# Patient Record
Sex: Male | Born: 1955 | Race: White | Hispanic: No | Smoking: Current some day smoker
Health system: Southern US, Community
[De-identification: ages and names within clinical notes are randomized; demographics above are authoritative.]

## PROBLEM LIST (undated history)

## (undated) DIAGNOSIS — E7800 Pure hypercholesterolemia, unspecified: Secondary | ICD-10-CM

## (undated) DIAGNOSIS — G43909 Migraine, unspecified, not intractable, without status migrainosus: Secondary | ICD-10-CM

## (undated) DIAGNOSIS — R51 Headache: Secondary | ICD-10-CM

## (undated) DIAGNOSIS — T7840XA Allergy, unspecified, initial encounter: Secondary | ICD-10-CM

## (undated) DIAGNOSIS — K635 Polyp of colon: Secondary | ICD-10-CM

## (undated) DIAGNOSIS — I10 Essential (primary) hypertension: Secondary | ICD-10-CM

## (undated) DIAGNOSIS — K219 Gastro-esophageal reflux disease without esophagitis: Secondary | ICD-10-CM

## (undated) DIAGNOSIS — J189 Pneumonia, unspecified organism: Secondary | ICD-10-CM

## (undated) DIAGNOSIS — K56609 Unspecified intestinal obstruction, unspecified as to partial versus complete obstruction: Secondary | ICD-10-CM

## (undated) DIAGNOSIS — R519 Headache, unspecified: Secondary | ICD-10-CM

## (undated) DIAGNOSIS — R739 Hyperglycemia, unspecified: Secondary | ICD-10-CM

## (undated) DIAGNOSIS — L719 Rosacea, unspecified: Secondary | ICD-10-CM

## (undated) DIAGNOSIS — N529 Male erectile dysfunction, unspecified: Secondary | ICD-10-CM

## (undated) DIAGNOSIS — M1990 Unspecified osteoarthritis, unspecified site: Secondary | ICD-10-CM

## (undated) DIAGNOSIS — K5790 Diverticulosis of intestine, part unspecified, without perforation or abscess without bleeding: Secondary | ICD-10-CM

## (undated) DIAGNOSIS — K222 Esophageal obstruction: Secondary | ICD-10-CM

## (undated) DIAGNOSIS — K529 Noninfective gastroenteritis and colitis, unspecified: Secondary | ICD-10-CM

## (undated) DIAGNOSIS — E785 Hyperlipidemia, unspecified: Secondary | ICD-10-CM

## (undated) DIAGNOSIS — K5792 Diverticulitis of intestine, part unspecified, without perforation or abscess without bleeding: Secondary | ICD-10-CM

## (undated) HISTORY — DX: Hyperlipidemia, unspecified: E78.5

## (undated) HISTORY — DX: Pure hypercholesterolemia, unspecified: E78.00

## (undated) HISTORY — DX: Hyperglycemia, unspecified: R73.9

## (undated) HISTORY — DX: Polyp of colon: K63.5

## (undated) HISTORY — PX: LUMBAR MICRODISCECTOMY: SHX99

## (undated) HISTORY — DX: Gastro-esophageal reflux disease without esophagitis: K21.9

## (undated) HISTORY — DX: Migraine, unspecified, not intractable, without status migrainosus: G43.909

## (undated) HISTORY — DX: Allergy, unspecified, initial encounter: T78.40XA

## (undated) HISTORY — DX: Essential (primary) hypertension: I10

## (undated) HISTORY — DX: Male erectile dysfunction, unspecified: N52.9

## (undated) HISTORY — DX: Diverticulitis of intestine, part unspecified, without perforation or abscess without bleeding: K57.92

## (undated) HISTORY — DX: Headache: R51

## (undated) HISTORY — DX: Unspecified osteoarthritis, unspecified site: M19.90

## (undated) HISTORY — DX: Unspecified intestinal obstruction, unspecified as to partial versus complete obstruction: K56.609

## (undated) HISTORY — DX: Diverticulosis of intestine, part unspecified, without perforation or abscess without bleeding: K57.90

## (undated) HISTORY — DX: Noninfective gastroenteritis and colitis, unspecified: K52.9

## (undated) HISTORY — PX: NASAL SEPTUM SURGERY: SHX37

## (undated) HISTORY — DX: Rosacea, unspecified: L71.9

## (undated) HISTORY — DX: Esophageal obstruction: K22.2

## (undated) HISTORY — DX: Headache, unspecified: R51.9

## (undated) HISTORY — DX: Pneumonia, unspecified organism: J18.9

---

## 2006-01-18 ENCOUNTER — Encounter: Admission: RE | Admit: 2006-01-18 | Discharge: 2006-01-18 | Payer: Self-pay | Admitting: Family Medicine

## 2006-07-08 ENCOUNTER — Ambulatory Visit: Payer: Self-pay | Admitting: Family Medicine

## 2006-12-01 ENCOUNTER — Ambulatory Visit: Payer: Self-pay | Admitting: Family Medicine

## 2007-02-14 ENCOUNTER — Ambulatory Visit: Payer: Self-pay | Admitting: Family Medicine

## 2007-03-17 ENCOUNTER — Ambulatory Visit: Payer: Self-pay | Admitting: Family Medicine

## 2007-11-03 HISTORY — PX: COLONOSCOPY: SHX174

## 2007-11-03 HISTORY — PX: UPPER GASTROINTESTINAL ENDOSCOPY: SHX188

## 2007-11-17 ENCOUNTER — Ambulatory Visit: Payer: Self-pay | Admitting: Family Medicine

## 2007-12-08 ENCOUNTER — Ambulatory Visit: Payer: Self-pay | Admitting: Gastroenterology

## 2007-12-08 DIAGNOSIS — R1310 Dysphagia, unspecified: Secondary | ICD-10-CM | POA: Insufficient documentation

## 2007-12-08 DIAGNOSIS — R1012 Left upper quadrant pain: Secondary | ICD-10-CM | POA: Insufficient documentation

## 2007-12-22 ENCOUNTER — Encounter: Payer: Self-pay | Admitting: Gastroenterology

## 2007-12-22 ENCOUNTER — Ambulatory Visit (HOSPITAL_COMMUNITY): Admission: RE | Admit: 2007-12-22 | Discharge: 2007-12-22 | Payer: Self-pay | Admitting: Gastroenterology

## 2007-12-28 ENCOUNTER — Ambulatory Visit: Payer: Self-pay | Admitting: Gastroenterology

## 2008-01-09 ENCOUNTER — Encounter: Payer: Self-pay | Admitting: Gastroenterology

## 2008-01-12 ENCOUNTER — Ambulatory Visit: Payer: Self-pay | Admitting: Gastroenterology

## 2008-07-30 ENCOUNTER — Ambulatory Visit: Payer: Self-pay | Admitting: Family Medicine

## 2008-07-30 ENCOUNTER — Telehealth: Payer: Self-pay | Admitting: Gastroenterology

## 2008-07-31 ENCOUNTER — Encounter: Payer: Self-pay | Admitting: Gastroenterology

## 2008-07-31 ENCOUNTER — Ambulatory Visit: Payer: Self-pay | Admitting: Gastroenterology

## 2008-07-31 DIAGNOSIS — R1319 Other dysphagia: Secondary | ICD-10-CM | POA: Insufficient documentation

## 2008-07-31 DIAGNOSIS — K219 Gastro-esophageal reflux disease without esophagitis: Secondary | ICD-10-CM | POA: Insufficient documentation

## 2008-08-02 ENCOUNTER — Telehealth: Payer: Self-pay | Admitting: Gastroenterology

## 2008-08-13 ENCOUNTER — Ambulatory Visit: Payer: Self-pay | Admitting: Gastroenterology

## 2008-08-13 ENCOUNTER — Ambulatory Visit (HOSPITAL_COMMUNITY): Admission: RE | Admit: 2008-08-13 | Discharge: 2008-08-13 | Payer: Self-pay | Admitting: Gastroenterology

## 2008-10-11 ENCOUNTER — Ambulatory Visit: Payer: Self-pay | Admitting: Family Medicine

## 2008-12-20 ENCOUNTER — Ambulatory Visit: Payer: Self-pay | Admitting: Family Medicine

## 2009-01-24 ENCOUNTER — Ambulatory Visit: Payer: Self-pay | Admitting: Family Medicine

## 2009-07-30 ENCOUNTER — Telehealth: Payer: Self-pay | Admitting: Gastroenterology

## 2009-12-09 ENCOUNTER — Ambulatory Visit: Payer: Self-pay | Admitting: Family Medicine

## 2009-12-23 ENCOUNTER — Ambulatory Visit: Payer: Self-pay | Admitting: Family Medicine

## 2010-03-20 ENCOUNTER — Ambulatory Visit: Payer: Self-pay | Admitting: Family Medicine

## 2010-03-27 ENCOUNTER — Ambulatory Visit: Payer: Self-pay | Admitting: Family Medicine

## 2011-02-24 ENCOUNTER — Encounter: Payer: Self-pay | Admitting: Family Medicine

## 2011-02-24 DIAGNOSIS — J069 Acute upper respiratory infection, unspecified: Secondary | ICD-10-CM

## 2011-03-17 NOTE — Assessment & Plan Note (Signed)
[facility]                         GASTROENTEROLOGY OFFICE NOTE   NAME:Klingberg, TANMAY HALTEMAN                       MRN:          [id number]  DATE:12/08/2007                            DOB:          [birth year]    PROBLEMS:  1. Dysphagia.  2. Abdominal pain.   Mr. Litaker is a pleasant 55 year old white male, referred through the  courtesy of Dr. Susann Givens for evaluation.  For years he has been suffering  from intermittent dysphagia to solids.  Symptoms clearly have worsened  over the past 2 months where it is almost a daily occurrence.  He has  occasional pyrosis.  He has had the requirement of regurgitating what  sounds like impacted food.  He also complains of a left upper quadrant  discomfort.  He has the feeling to defecate, but has an incomplete  evacuation, passing just loose or watery stools, coincident with his  left upper quadrant pain.  This initially was occurring daily, and now  is intermittent.  He does have a history of erratic bowels consisting of  periods of constipation and diarrhea.  On one occasion he saw a small  amount of blood on the toilet tissue.   PAST MEDICAL HISTORY:  Pertinent for hypertension and high cholesterol.  He suffers from migraines.   FAMILY HISTORY:  Pertinent for a brother with a bleeding disorder, and a  brother and mother with heart disease.   MEDICATIONS:  The only medication is bisoprolol.   He has no allergies.   He does not smoke, he drinks 3 or 4 beers a day.  He is divorced and  works for the [employer].   REVIEW OF SYSTEMS:  Positive for sleeping problems and back pain.   PHYSICAL EXAMINATION:  Pulse 96, blood pressure 130/86, weight 201.  HEENT: EOMI.  PERRLA.  Sclerae are anicteric.  Conjunctivae are pink.  NECK:  Supple without thyromegaly, adenopathy or carotid bruits.  CHEST:  Clear to auscultation and percussion without adventitious  sounds.  CARDIAC:  Regular rhythm; normal S1 S2.  There  are no murmurs, gallops  or rubs.  ABDOMEN:  Bowel sounds are normoactive.  Abdomen is soft, nontender and  nondistended.  There are no abdominal masses, tenderness, splenic  enlargement or hepatomegaly.  EXTREMITIES:  Full range of motion.  No cyanosis, clubbing or edema.  RECTAL:  Deferred.   IMPRESSION:  1. Dysphagia:  Probably secondary to peptic esophageal stricture.  2. Left upper quadrant pain with a change of bowel habits.  A      structural lesion of the colon is a consideration.  This could be a      variant of irritable bowel syndrome.   RECOMMENDATION:  1. Begin Prevacid 30 mg a day.  2. Upper endoscopy with dilatation is indicated.  3. Colonoscopy.     Barbette Hair. Arlyce Dice, MD,FACG  Electronically Signed    RDK/MedQ  DD: 12/08/2007  DT: 12/09/2007  Job #: [id number]   cc:   Sharlot Gowda, M.D.

## 2011-03-17 NOTE — Letter (Signed)
December 08, 2007    [name]. Micheal Hodge   READAM, SANJUAN  MRN:  [id number]  /  DOB:  20-Jan-1956   Dear [name]:   It is my pleasure to have treated you recently as a new patient in my  office.  I appreciate your confidence and the opportunity to participate  in your care.   Since I do have a busy inpatient endoscopy schedule and office schedule,  my office hours vary weekly.  I am, however, available for emergency  calls every day through my office.  If I cannot promptly meet an urgent  office appointment, another one of our gastroenterologists will be able  to assist you.   My well-trained staff are prepared to help you at all times.  For  emergencies after office hours, a physician from our gastroenterology  section is always available through my 24-hour answering service.   While you are under my care, I encourage discussion of your questions  and concerns, and I will be happy to return your calls as soon as I am  available.   Once again, I welcome you as a new patient and I look forward to a happy  and healthy relationship.    Sincerely,      Barbette Hair. Arlyce Dice, MD,FACG  Electronically Signed   RDK/MedQ  DD: 12/08/2007  DT: 12/09/2007  Job #: [id number]

## 2011-03-17 NOTE — Letter (Signed)
December 08, 2007    Sharlot Gowda, M.D.  [address]  [place], [place] [address]   RE:  [name], DOME  MRN:  [id number]  /  DOB:  10-19-1956   Dear Dr. Susann Givens:   Upon your kind referral, I had the pleasure of evaluating your patient  and I am pleased to offer my findings.  I saw Micheal Hodge in the office  today.  Enclosed is a copy of my progress note that details my findings  and recommendations.   Thank you for the opportunity to participate in your patient's care.    Sincerely,      Barbette Hair. Arlyce Dice, MD,FACG  Electronically Signed    RDK/MedQ  DD: 12/08/2007  DT: 12/09/2007  Job #: [id number]

## 2011-03-23 ENCOUNTER — Ambulatory Visit (INDEPENDENT_AMBULATORY_CARE_PROVIDER_SITE_OTHER): Payer: 59 | Admitting: Family Medicine

## 2011-03-23 ENCOUNTER — Encounter: Payer: Self-pay | Admitting: Family Medicine

## 2011-03-23 VITALS — BP 138/90 | HR 72 | Ht 73.2 in | Wt 198.0 lb

## 2011-03-23 DIAGNOSIS — K219 Gastro-esophageal reflux disease without esophagitis: Secondary | ICD-10-CM

## 2011-03-23 DIAGNOSIS — G43909 Migraine, unspecified, not intractable, without status migrainosus: Secondary | ICD-10-CM

## 2011-03-23 DIAGNOSIS — G609 Hereditary and idiopathic neuropathy, unspecified: Secondary | ICD-10-CM

## 2011-03-23 DIAGNOSIS — Z0000 Encounter for general adult medical examination without abnormal findings: Secondary | ICD-10-CM

## 2011-03-23 DIAGNOSIS — I10 Essential (primary) hypertension: Secondary | ICD-10-CM

## 2011-03-23 DIAGNOSIS — E785 Hyperlipidemia, unspecified: Secondary | ICD-10-CM

## 2011-03-23 DIAGNOSIS — G629 Polyneuropathy, unspecified: Secondary | ICD-10-CM

## 2011-03-23 DIAGNOSIS — N529 Male erectile dysfunction, unspecified: Secondary | ICD-10-CM

## 2011-03-23 DIAGNOSIS — K222 Esophageal obstruction: Secondary | ICD-10-CM

## 2011-03-23 LAB — VITAMIN B12: Vitamin B-12: 326 pg/mL (ref 211–911)

## 2011-03-23 LAB — COMPREHENSIVE METABOLIC PANEL
ALT: 21 U/L (ref 0–53)
AST: 18 U/L (ref 0–37)
Albumin: 4.5 g/dL (ref 3.5–5.2)
CO2: 27 mEq/L (ref 19–32)
Calcium: 10 mg/dL (ref 8.4–10.5)
Creat: 0.83 mg/dL (ref 0.40–1.50)
Potassium: 4.8 mEq/L (ref 3.5–5.3)
Total Protein: 7.3 g/dL (ref 6.0–8.3)

## 2011-03-23 LAB — CBC WITH DIFFERENTIAL/PLATELET
Basophils Absolute: 0 10*3/uL (ref 0.0–0.1)
Eosinophils Absolute: 0.1 10*3/uL (ref 0.0–0.7)
Eosinophils Relative: 1 % (ref 0–5)
HCT: 48.6 % (ref 39.0–52.0)
Hemoglobin: 16.4 g/dL (ref 13.0–17.0)
Lymphocytes Relative: 24 % (ref 12–46)
Lymphs Abs: 2 10*3/uL (ref 0.7–4.0)
MCH: 30 pg (ref 26.0–34.0)
MCHC: 33.7 g/dL (ref 30.0–36.0)
Monocytes Absolute: 0.7 10*3/uL (ref 0.1–1.0)
Monocytes Relative: 9 % (ref 3–12)
RBC: 5.46 MIL/uL (ref 4.22–5.81)

## 2011-03-23 LAB — POCT URINALYSIS DIPSTICK
Blood, UA: NEGATIVE
Protein, UA: NEGATIVE
Spec Grav, UA: 1.025
Urobilinogen, UA: NEGATIVE
pH, UA: 5

## 2011-03-23 LAB — LIPID PANEL
HDL: 37 mg/dL — ABNORMAL LOW (ref >39)
Triglycerides: 276 mg/dL — ABNORMAL HIGH (ref <150)

## 2011-03-23 LAB — FOLATE: Folate: 10.4 ng/mL

## 2011-03-23 MED ORDER — BISOPROLOL-HYDROCHLOROTHIAZIDE 5-6.25 MG PO TABS: 1.0000 | ORAL_TABLET | Freq: Every day | ORAL | Status: DC

## 2011-03-23 MED ORDER — OMEPRAZOLE 40 MG PO CPDR: 40.0000 mg | DELAYED_RELEASE_CAPSULE | Freq: Every day | ORAL | Status: DC

## 2011-03-23 NOTE — Patient Instructions (Addendum)
Cut back on alcohol consumption to one or 2 per night. I will get information from Dr. Lajoyce Corners and await lab results. Might possibly refer to neurology for the neuropathy.

## 2011-03-23 NOTE — Progress Notes (Signed)
Subjective:    Patient ID: Micheal Hodge, male    DOB: 1956/05/17, 55 y.o.   MRN: [id number]  HPI he is here for a complete examination. He is on medications listed in the chart. He has had difficulty with back pain as well as bilateral foot numbness and a burning sensation. He has had an MRI and apparently had epidural injections which did help his back pain but had no effect on his property. He was apparently told he had a B12 deficiency however there has been no followup on this. His allergies seem to be under good control. He has not had difficulty with his migraine headaches. He does get routine followup on his reflux symptoms as well as stricture. He is doing well on Prilosec. He is not dating anybody at the present time and will call me when he needs his Cialis renewed. He continues to take care of his son who is disabled due to a motor vehicle accident and neurologic damage. Family history was reviewed. He does not smoke. He does drink sometimes 3 or more beers per day.    Review of Systems  Constitutional: Negative.   HENT: Negative.   Eyes: Negative.   Respiratory: Negative.   Cardiovascular: Negative.   Gastrointestinal: Negative.   Genitourinary: Negative.   Musculoskeletal: Negative.        Objective:   Physical ExamBP 138/90  Pulse 72  Ht 6' 1.2" (1.859 m)  Wt 198 lb (89.812 kg)  BMI 25.98 kg/m2  General Appearance:    Alert, cooperative, no distress, appears stated age  Head:    Normocephalic, without obvious abnormality, atraumatic  Eyes:    PERRL, conjunctiva/corneas clear, EOM's intact, fundi    benign  Ears:    Normal TM's and external ear canals  Nose:   Nares normal, mucosa normal, no drainage or sinus   tenderness  Throat:   Lips, mucosa, and tongue normal; teeth and gums normal  Neck:   Supple, no lymphadenopathy;  thyroid:  no   enlargement/tenderness/nodules; no carotid   bruit or JVD  Back:    Spine nontender, no curvature, ROM normal, no CVA      tenderness  Lungs:     Clear to auscultation bilaterally without wheezes, rales or     ronchi; respirations unlabored  Chest Wall:    No tenderness or deformity   Heart:    Regular rate and rhythm, S1 and S2 normal, no murmur, rub   or gallop  Breast Exam:    No chest wall tenderness, masses or gynecomastia  Abdomen:     Soft, non-tender, nondistended, normoactive bowel sounds,    no masses, no hepatosplenomegaly  Genitalia:    Normal male external genitalia without lesions.  Testicles without masses.  No inguinal hernias.  Rectal:    Normal sphincter tone, no masses or tenderness; guaiac negative stool.  Prostate smooth, no nodules, not enlarged.  Extremities:   No clubbing, cyanosis or edema  Pulses:   2+ and symmetric all extremities  Skin:   Skin color, texture, turgor normal, no rashes or lesions  Lymph nodes:   Cervical, supraclavicular, and axillary nodes normal  Neurologic:   CNII-XII intact, normal strength, sensation and gait; reflexes 2+ and symmetric throughout          Psych:   Normal mood, affect, hygiene and grooming.            Assessment & Plan:  see chronic problem list I will check routine blood on  him including vitamin B12 and folate acid. He will call me when he needs Cialis renewed. I discussed cutting back on his alcohol consumption. We'll also get information from Dr. due to concerning his MRI and possible B12 deficiency. His medications were renewed. I will await blood results and possibly refer to neurology if needed.

## 2011-03-24 ENCOUNTER — Telehealth: Payer: Self-pay

## 2011-03-24 NOTE — Telephone Encounter (Signed)
Informed pt of labs he said he would call back later and let me know if he wanted to go to neurology or to see Dr.L for med for neuropathy also mailed labs and diet info

## 2011-04-08 ENCOUNTER — Telehealth: Payer: Self-pay | Admitting: Family Medicine

## 2011-04-09 MED ORDER — AMITRIPTYLINE HCL 10 MG PO TABS: 10.0000 mg | ORAL_TABLET | Freq: Every day | ORAL | Status: DC

## 2011-04-09 NOTE — Telephone Encounter (Signed)
He has had difficulty with neuropathic pain. I will place him on amitriptyline at 10 mg. He is to call me in a week or 2 and let me know how he is doing.

## 2011-06-07 ENCOUNTER — Inpatient Hospital Stay: Payer: Self-pay | Admitting: Surgery

## 2011-06-15 ENCOUNTER — Other Ambulatory Visit (INDEPENDENT_AMBULATORY_CARE_PROVIDER_SITE_OTHER): Payer: 59

## 2011-06-15 ENCOUNTER — Telehealth: Payer: Self-pay | Admitting: Gastroenterology

## 2011-06-15 ENCOUNTER — Ambulatory Visit (INDEPENDENT_AMBULATORY_CARE_PROVIDER_SITE_OTHER): Payer: 59 | Admitting: Nurse Practitioner

## 2011-06-15 ENCOUNTER — Encounter: Payer: Self-pay | Admitting: Nurse Practitioner

## 2011-06-15 ENCOUNTER — Other Ambulatory Visit: Payer: Self-pay

## 2011-06-15 VITALS — BP 150/92 | HR 96 | Ht 72.0 in | Wt 187.6 lb

## 2011-06-15 DIAGNOSIS — K5732 Diverticulitis of large intestine without perforation or abscess without bleeding: Secondary | ICD-10-CM

## 2011-06-15 DIAGNOSIS — K5900 Constipation, unspecified: Secondary | ICD-10-CM | POA: Insufficient documentation

## 2011-06-15 DIAGNOSIS — K648 Other hemorrhoids: Secondary | ICD-10-CM

## 2011-06-15 MED ORDER — HYDROCORTISONE ACETATE 25 MG RE SUPP: RECTAL | Status: DC

## 2011-06-15 NOTE — Telephone Encounter (Signed)
Pt states he was in the hospital last Sunday through Thursday for diverticulitis. Pt reports that they wanted to remove part of his colon. Pt was placed on Cipro and Flagyl and Percocet. He is complaining that his pain got worse over the weekend in the [place]. He has been taking the Percocet but is now also having problems with constipation and requests to be seen. Pt scheduled to see [name] NP 06/15/11@2pm . Pt aware of appt date and time.

## 2011-06-15 NOTE — Patient Instructions (Signed)
Please go to the basement level to have your labs drawn.  We sent a prescription for Anusol HC Suppositories to [facility]. Take 1 dose of mirlrax in a glass of water or clear juice twice daily An & PM until you have a bowel movement.  Then take a dose once daily.  Complete the Cipro and Flagyl ( Metronidazole).    Call us Thurs 8-16 if pain or constipation is not better or you are running a fever.  We made a follow up appointment with Dr. Annetta Maw on 07-24-2011 at 10:00 Am.

## 2011-06-15 NOTE — Progress Notes (Signed)
06/15/2011 Micheal Hodge [id number] Aug 02, 1956   HISTORY OF PRESENT ILLNESS: Micheal Hodge is a 55 year old male known to Dr. Arlyce Dice for a history of GERD and esophageal strictures. He was last seen at time of EGD with dilation in October 2009. He had a colonoscopy for screening in March 2009 which revealed diffuse diverticulosis. The patient was hospitalized last week in [place] for diverticulitis, this was his first episode. I reviewed the records. CT scan of the abdomen and pelvis with contrast compatible with acute sigmoid diverticulitis. Some extraluminal air concerning for microperforation was seen. On admission patient's white count was 17,000. Patient was given IV antibiotics and his WBC came down to 11.1. He stayed in the hospital for 3 or 4 days and was then sent home with 10 days of Cipro and Flagyl which he is still taking. Micheal Hodge overall feels better but not as well as he thinks he should be feeling. He has been taking oxycodone since hospital discharge. He needs that for some persistent abdominal discomfort. No fevers at home. No nausea but his appetite is poor. Patient has chronic alternating constipation / diarrhea but lately more constipated and that is probably secondary to the Oxycodone he has been taking for abdominal discomfort since hospital discharge. A couple of times patient has had a small amount of red blood on the toilet tissue when he wipes. Doesn't think he has had any fevers at home. No nausea since last week but appetite is poor.      Past Surgical History  Procedure Date  . Upper gastrointestinal endoscopy 2009  . Colonoscopy 2009  . Nasal septum surgery     reports that he quit smoking about 24 years ago. He has never used smokeless tobacco. He reports that he drinks about 12 ounces of alcohol per week. He reports that he does not use illicit drugs. family history includes Cancer in his mother; Diabetes in his mother; Heart disease in his mother; Hyperlipidemia  in his mother; and Hypertension in his father. No Known Allergies    Outpatient Encounter Prescriptions as of 06/15/2011  Medication Sig Dispense Refill  . bisoprolol-hydrochlorothiazide (ZIAC) 10-6.25 MG per tablet Take 1 tablet by mouth daily.        . ciprofloxacin (CIPRO) 500 MG tablet Take 500 mg by mouth 2 (two) times daily.        . metroNIDAZOLE (FLAGYL) 500 MG tablet Take 500 mg by mouth 3 (three) times daily.        Marland Kitchen omeprazole (PRILOSEC) 40 MG capsule Take 40 mg by mouth daily.        Marland Kitchen oxyCODONE-acetaminophen (PERCOCET) 5-325 MG per tablet Take 1 tablet by mouth every 4 (four) hours as needed.        Marland Kitchen DISCONTD: amitriptyline (ELAVIL) 10 MG tablet Take 1 tablet (10 mg total) by mouth at bedtime.  30 tablet  11  . DISCONTD: bisoprolol-hydrochlorothiazide (ZIAC) 5-6.25 MG per tablet Take 1 tablet by mouth daily.  30 tablet  11  . DISCONTD: omeprazole (PRILOSEC) 40 MG capsule Take 1 capsule (40 mg total) by mouth daily.  30 capsule  1   Past Medical History  Diagnosis Date  . Rosacea   . Migraine headache   . Allergy   . Dyslipidemia   . Hypertension   . Esophageal stricture   . Diverticulosis   . ED (erectile dysfunction)   . GERD (gastroesophageal reflux disease)   . Hyperlipidemia   . Diverticulitis    REVIEW OF SYSTEMS  :  All other systems reviewed and negative except where noted in the History of Present Illness.   PHYSICAL EXAM: General: Well developed white male in no acute distress Head: Normocephalic and atraumatic Eyes:  sclerae anicteric,conjunctive pink. Ears: Normal auditory acuity Mouth: No deformity or lesions Neck: Supple, no masses.  Lungs: Clear throughout to auscultation Heart: Regular rate and rhythm; no murmurs heard Abdomen: Soft, non distended, mild left lower quadrant tenderness. No masses or hepatomegaly noted. Normal Bowel sounds Rectal: Not done Musculoskeletal: Symmetrical with no gross deformities  Skin: No lesions on visible  extremities Extremities: No edema or deformities noted Neurological: Alert oriented x 4, grossly nonfocal Cervical Nodes:  No significant cervical adenopathy Psychological:  Alert and cooperative. Normal mood and affect  ASSESSMENT AND PLAN;

## 2011-06-16 LAB — CBC WITH DIFFERENTIAL/PLATELET
Basophils Absolute: 0.1 10*3/uL (ref 0.0–0.1)
Basophils Relative: 0.9 % (ref 0.0–3.0)
Eosinophils Absolute: 0.1 10*3/uL (ref 0.0–0.7)
HCT: 49.9 % (ref 39.0–52.0)
Hemoglobin: 16.6 g/dL (ref 13.0–17.0)
Lymphocytes Relative: 7.3 % — ABNORMAL LOW (ref 12.0–46.0)
Lymphs Abs: 1.1 10*3/uL (ref 0.7–4.0)
Neutro Abs: 12.4 10*3/uL — ABNORMAL HIGH (ref 1.4–7.7)
Neutrophils Relative %: 80.6 % — ABNORMAL HIGH (ref 43.0–77.0)
Platelets: 387 10*3/uL (ref 150.0–400.0)
RDW: 13.3 % (ref 11.5–14.6)

## 2011-06-17 ENCOUNTER — Encounter: Payer: Self-pay | Admitting: Nurse Practitioner

## 2011-06-17 NOTE — Assessment & Plan Note (Signed)
Internal hemorrhoids on anoscopy. Recommend 7 days daily Anusol-HC suppositories. Treat constipation.

## 2011-06-17 NOTE — Assessment & Plan Note (Signed)
Sigmoid diverticulitis with microperforation. Patient was hospitalized in [facility] for a few days last week. He is still on antibiotics in the form of Flagyl and Cipro. Colon resection was apparently recommended but patient was not ready to entertain that. Micheal Hodge is still having some abdominal discomfort though his abdominal examination is not overly concerning. I recommended he complete the full course of Cipro and Flagyl. Will treat his constipation. Patient will return to clinic in a few weeks for reevaluation at which time he can discuss need for colon resection with Dr. Arlyce Dice, his primary gastroenterologist. He knows that diverticulitis can lead to serious problems and so he will call our office for worsening abdominal pain and/or fever.

## 2011-06-17 NOTE — Assessment & Plan Note (Signed)
Begin MiraLax for constipation. Willl start at increased dose to get bowels moving and patient can decrease to once daily.

## 2011-06-18 ENCOUNTER — Ambulatory Visit (INDEPENDENT_AMBULATORY_CARE_PROVIDER_SITE_OTHER)
Admission: RE | Admit: 2011-06-18 | Discharge: 2011-06-18 | Disposition: A | Discharge status: Home / Self Care | Payer: 59 | Source: Ambulatory Visit | Attending: Nurse Practitioner | Admitting: Nurse Practitioner

## 2011-06-18 ENCOUNTER — Telehealth: Payer: Self-pay | Admitting: *Deleted

## 2011-06-18 ENCOUNTER — Inpatient Hospital Stay (HOSPITAL_COMMUNITY)
Admission: AD | Admit: 2011-06-18 | Discharge: 2011-06-21 | DRG: 392 | Disposition: A | Discharge status: Home / Self Care | Payer: 59 | Source: Ambulatory Visit | Attending: Gastroenterology | Admitting: Gastroenterology

## 2011-06-18 DIAGNOSIS — K5792 Diverticulitis of intestine, part unspecified, without perforation or abscess without bleeding: Secondary | ICD-10-CM

## 2011-06-18 DIAGNOSIS — K219 Gastro-esophageal reflux disease without esophagitis: Secondary | ICD-10-CM | POA: Diagnosis present

## 2011-06-18 DIAGNOSIS — E785 Hyperlipidemia, unspecified: Secondary | ICD-10-CM | POA: Diagnosis present

## 2011-06-18 DIAGNOSIS — K5732 Diverticulitis of large intestine without perforation or abscess without bleeding: Secondary | ICD-10-CM

## 2011-06-18 DIAGNOSIS — I10 Essential (primary) hypertension: Secondary | ICD-10-CM | POA: Diagnosis present

## 2011-06-18 MED ORDER — IOHEXOL 300 MG/ML  SOLN
100.0000 mL | Freq: Once | INTRAMUSCULAR | Status: AC | PRN
  Administered 2011-06-18: 100 mL via INTRAVENOUS

## 2011-06-18 NOTE — Telephone Encounter (Signed)
Patient called back and decided to do the CT scan. Scheduled patient at [facility] CT today at 2:30 PM. NPO 4 hours prior. Patient to go to [place] CT and pick up contrast by 11:30/12:00 today.

## 2011-06-18 NOTE — Telephone Encounter (Signed)
Received a call from [facility] to have [name], NP look at CT results. They still have patient at [facility] CT. Per Gunnar Fusi, patient needs to be admitted because the diverticulitis is not getting better on oral antibiotics. Needs IV antibiotics. Patient to go to [facility] admitting and will be admitted to [facility]. Patient aware and [name], NP notified.

## 2011-06-18 NOTE — Progress Notes (Signed)
Reviewed and agree  To continue antibiotics and discuss ?lap assisted sigmoid resection

## 2011-06-18 NOTE — Telephone Encounter (Signed)
Spoke with patient. He states his pain is not as much as it was. He still feels come cramping and stomach is bloated on left side. Denies fevers. He states he is taking Miralax and has had small amount of stool. States he feels if he could have a good bowel movement he would feel better. Discussed getting a CT or seeing surgeon. Patient wants to think about it. He is wanting to be sure insurance will pay for CT and states he is not sure he wants to see that surgeon. He will call me back after he thinks about it. Told patient he would need to decide soon.

## 2011-06-18 NOTE — Telephone Encounter (Signed)
Message copied by Daphine Deutscher on Thu Jun 18, 2011  9:12 AM ------      Message from: Meredith Pel      Created: Wed Jun 17, 2011  9:40 PM       Rene Kocher, his WBC is elevated despite antibiotics. Please call to see how he feels.  If not feeling better and / or having fevers then patient needs CTscan of abd/pelvis with contrast ASAP for reevaluation of diverticulitis and to rule out abscess.  His other option is to get in to see the surgeon in [facility] who saw him while recently hospitalized for diverticulitis but he would need to be seen within a couple of days. If at any time he has significant pain or fever 101 or greater, he needs to go to ER. Please let me know. Thanks

## 2011-06-19 DIAGNOSIS — K5732 Diverticulitis of large intestine without perforation or abscess without bleeding: Secondary | ICD-10-CM

## 2011-06-19 LAB — CBC
HCT: 49 % (ref 39.0–52.0)
MCH: 28.8 pg (ref 26.0–34.0)
MCHC: 33.5 g/dL (ref 30.0–36.0)
Platelets: 440 10*3/uL — ABNORMAL HIGH (ref 150–400)
RDW: 12.8 % (ref 11.5–15.5)

## 2011-06-19 LAB — DIFFERENTIAL
Eosinophils Relative: 3 % (ref 0–5)
Lymphs Abs: 2.4 10*3/uL (ref 0.7–4.0)
Neutro Abs: 4 10*3/uL (ref 1.7–7.7)

## 2011-06-19 LAB — BASIC METABOLIC PANEL
CO2: 32 mEq/L (ref 19–32)
Calcium: 9.8 mg/dL (ref 8.4–10.5)
GFR calc Af Amer: >60 mL/min (ref >60)
GFR calc non Af Amer: >60 mL/min (ref >60)
Glucose, Bld: 111 mg/dL — ABNORMAL HIGH (ref 70–99)
Potassium: 4.1 mEq/L (ref 3.5–5.1)

## 2011-06-20 DIAGNOSIS — K5732 Diverticulitis of large intestine without perforation or abscess without bleeding: Secondary | ICD-10-CM

## 2011-06-21 DIAGNOSIS — K5732 Diverticulitis of large intestine without perforation or abscess without bleeding: Secondary | ICD-10-CM

## 2011-06-22 ENCOUNTER — Telehealth: Payer: Self-pay | Admitting: Gastroenterology

## 2011-06-22 NOTE — Telephone Encounter (Signed)
Left message for pt to call back.  Pt scheduled to see Dr. Arlyce Dice 07/14/11@2 :30pm post hospitalization. Pt aware of appt date and time.

## 2011-06-23 NOTE — Consult Note (Signed)
Micheal Hodge, Micheal Hodge                ACCOUNT NO.:  [id number]  MEDICAL RECORD NO.:  [id number]  LOCATION:  1503                         FACILITY:  [facility]  PHYSICIAN:  Micheal Sportsman, MD     DATE OF BIRTH:  1956/05/11  DATE OF CONSULTATION:  06/19/2011 DATE OF DISCHARGE:                                CONSULTATION   REFERRING PHYSICIAN:  Dr. Barbette Hair. Arlyce Hodge, Hodge.  PRIMARY CARE PHYSICIAN:  Dr. Trenton Hodge.  REASON FOR CONSULTATION:  Diverticulitis with microperforation.  BRIEF HISTORY:  The patient is a 55 year old gentleman who presented to [facility] on June 07, 2011, with abdominal pain and was found to have diverticulitis with microperforation.  He was seen at that time and offered elective surgery with colostomy at that time.  He preferred to have a more noninvasive treatment and opted for medical management. On admission, his white count was 17,000.  He was treated with Cipro and Flagyl and subsequently discharged home on June 11, 2011.  He is continued to be on Cipro and Flagyl at home.  He continued to feel less well than he thought he should.  He was seen yesterday on June 18, 2011, in Micheal Hodge office, where his white count was up to 15,000 and he was subsequently readmitted by Micheal Hodge office.  CT scan was obtained which showed stranding and wall thickening of sigmoid colon with diverticulitis.  There were some punctate foci with extraluminal air, but no drainable fluid, collections, or abscess seen.  There was also a left renal abscess.  He was admitted and his Cipro and Flagyl were discontinued and he was changed to Unasyn and Fagyl.  Since yesterday, his white count has improved.  He still has some tenderness in his left upper quadrant, but right now, his primary complaint is his back pain.  PAST MEDICAL HISTORY: 1. Hypertension. 2. Migraines with none since 2006. 3. Dyslipidemia. 4. History of esophageal strictures and GERD. 5. New  diagnosis of diverticulitis, prior history of diverticulosis on     colonoscopy. 6. Erectile dysfunction. 7. Dyslipidemia. 8. Back pain.  PAST SURGICAL HISTORY:  He had a nasal septal surgery in the past.  He had a colonoscopy approximately 2 years ago, where he was found to have diverticulosis and polyps.  FAMILY HISTORY:  Father is living with high blood pressure.  Mother is living with diabetes, coronary artery disease, stents, and a pacemaker. Two brothers with a lot of difficulties, he is not sure what.  SOCIAL HISTORY:  He is divorced.  He works as a Museum/gallery curator.  Tobacco:  25 years, up to two packs a day, quit in 1988. Alcohol:  He drinks approximately three beers a day.  Drugs:  None.  REVIEW OF SYSTEMS:  Positive for fever on June 07, 2011, and June 15, 2011, he had chills with his original admission.  His weight has gone from 200 down to 187 on Monday and his most recent weight is down to 181 yesterday.  CARDIAC:  No history of chest pain, palpitations, or MI. PULMONARY:  No orthopnea.  No PND.  No dyspnea on exertion.  No coughing, wheezing, or recent URI.  GI:  Positive for abdominal pain, some nausea.  Originally, no vomiting, some constipation.  No diarrhea. He had some blood in the past with hemorrhoids.  GU:  Positive for ED. No trouble voiding.  LOWER EXTREMITIES:  No edema.  He complains of calf pain with ambulation.  SKIN:  He has history of rosacea.  ENDOCRINE: Negative.  CURRENT MEDICATIONS: 1. Ziac 10/6.25 one daily. 2. Cipro 500 mg b.i.d. 3. Flagyl 500 mg t.i.d. 4. Prilosec 40 mg daily. 5. Percocet p.r.n.  ALLERGIES:  None known.  PHYSICAL EXAMINATION:  GENERAL:  Well-nourished, well-developed white male, in no acute distress. VITAL SIGNS:  Temperature is 97.5, heart rate is 60, respiratory rate is 16, blood pressure is 127/84, and sats 98% on room air. HEAD:  Normocephalic.  Eyes, ears, nose and throat are within normal limits.   Mucosa is normal.  Trachea is in the midline.  There are no bruits.  No JVD.  No thyromegaly. CHEST:  Clear to auscultation and percussion.  Normal respiratory effort.  Lungs are clear.  Chest wall is nontender. Cardiac:  Normal S1 and S2.  No murmur or rub was heard.  Pulses are +2 and equal in both the upper and lower extremities.  Bowel sounds are present.  He is nondistended.  He is slightly tender in the left upper quadrant.  No hernias, masses, or abscesses.  GU/RECTAL:  Deferred. Lymphadenopathy none palpated, cervical, axillary or femoral. MUSCULOSKELETAL:  No changes. SKIN:  No changes. NEUROLOGIC:  No focal deficits.  Cranial nerves grossly intact. PSYCH:  Normal affect.  White count is 7.6, hemoglobin is 16, hematocrit is 49, and platelets 440,000.  Sodium is 138, potassium is 4.1, chloride is 99, CO2 is 32, BUN is 6, creatinine 0.65, and glucose 111.  IMPRESSION: 1. Sigmoid diverticulitis with microperforation, better on Unasyn and     Flagyl. 2. Hypertension. 3. Migraines. 4. Gastroesophageal reflux disease/esophageal strictures. 5. History of back pain. 6. Erectile dysfunction. 7. Dyslipidemia.  PLAN:  Reviewed with Dr. Michaell Hodge.  He is already doing better on alternate antibiotic theraphy.  We will change to [employer].  Hopefully, he will improve on the new  antibiotic regime with further workup and evaluation after this has resolved. Micheal Hodge would like to avoid surgery, but will most likely reqire it at some point.     Micheal Hodge, P.A.   ______________________________ Micheal Sportsman, MD    Micheal Hodge  D:  06/19/2011  T:  06/19/2011  Job:  [id number]  cc:   Micheal Hair. Arlyce Hodge, Hodge [address] [place] [address]  Micheal Hodge Fax: [phone]  Electronically Signed by Micheal Hodge P.A. on 06/19/2011 10:53:51 PM Electronically Signed by Micheal Soda MD on 06/23/2011 01:56:54 PM

## 2011-06-24 ENCOUNTER — Other Ambulatory Visit: Payer: Self-pay | Admitting: Family Medicine

## 2011-06-24 ENCOUNTER — Telehealth: Payer: Self-pay | Admitting: Gastroenterology

## 2011-06-24 MED ORDER — BISOPROLOL-HYDROCHLOROTHIAZIDE 10-6.25 MG PO TABS: 1.0000 | ORAL_TABLET | Freq: Every day | ORAL | Status: DC

## 2011-06-24 NOTE — Telephone Encounter (Signed)
Pt states that where his IV was in the hospital there is a red knot. Pt states it is sore. Encouraged pt to have his PCP look at it. Let pt know that sometimes there can be some phlebitis or hematomas present and that if it looked infected he should definitely have it looked at. Pt verbalized understanding.

## 2011-07-14 ENCOUNTER — Ambulatory Visit (INDEPENDENT_AMBULATORY_CARE_PROVIDER_SITE_OTHER): Payer: 59 | Admitting: Gastroenterology

## 2011-07-14 ENCOUNTER — Encounter: Payer: Self-pay | Admitting: Gastroenterology

## 2011-07-14 VITALS — BP 150/90 | HR 68 | Ht 73.0 in | Wt 190.0 lb

## 2011-07-14 DIAGNOSIS — K5732 Diverticulitis of large intestine without perforation or abscess without bleeding: Secondary | ICD-10-CM

## 2011-07-14 NOTE — Patient Instructions (Signed)
Follow up as needed

## 2011-07-14 NOTE — Assessment & Plan Note (Signed)
Clinically resolved on antibiotic therapy.  Recommendations #1 should he have another episode of acute diverticulitis anytime in the near future I think he would be a candidate for elective sigmoid resection

## 2011-07-14 NOTE — Discharge Summary (Signed)
[name], [name]                ACCOUNT NO.:  [id number]  MEDICAL RECORD NO.:  [id number]  LOCATION:  1503                         FACILITY:  [facility]  PHYSICIAN:  Barbette Hair. Arlyce Dice, MD,FACGDATE OF BIRTH:  01-12-1956  DATE OF ADMISSION:  06/18/2011 DATE OF DISCHARGE:  06/21/2011                              DISCHARGE SUMMARY   PRIMARY CARE DOCTOR:  Sharlot Gowda, M.D.  GI PRIMARY:  Dr. Barbette Hair. Arlyce Dice, MD,FACG.  ADMITTING DIAGNOSES: 1. Sigmoid diverticulitis with microperforation.  Failed course of     inpatient followed by outpatient antibiotics.  White blood cell     count is up compared with a few days ago.  CT still confirms     sigmoid diverticulitis with microperforation, but no abscess. 2. Hypertension. 3. Migraine headaches. 4. Dyslipidemia. 5. Esophageal stricture, dilated in 2009. 6. Erectile dysfunction. 7. Gastroesophageal reflux disease. 8. Status post nasal septal surgery.  DISCHARGE DIAGNOSIS:  Sigmoid diverticulitis, clinically resolving.  PROCEDURES:  None.  CONSULTATIONS:  With General Surgery, Dr. Karie Soda.  BRIEF HISTORY:  [name] is a 55 year old gentleman who had been hospitalized at [facility] on June 07, 2011, through June 11, 2011, for sigmoid diverticulitis with microperforation.  The surgeon there advised surgery with initial colostomy.  The patient was not eager for surgery and medical therapy was provided with 4 days of IV antibiotics.  He went home on a 10-day course of oral ciprofloxacin and Flagyl.  Around June 11, 2011, the patient developed progressive and quite severe left-sided abdominal pain consistent with his prior presentation of diverticulitis.  There was some nausea.  He went to see [facility] GI nurse practitioner, Tiburcio Pea, on June 15, 2011.  She ordered some labs which revealed his white blood cell count to have climbed to 15.4 compared with 11.1 when he left the hospital at [facility].  CT scan was  performed on June 18, 2011, which showed ongoing sigmoid diverticulitis with microperforation.  The area was described as non-drainable.  There was no obvious abscess.  The patient was then admitted to [facility] for IV antibiotics and surgical evaluation.  Clinically, the severe pain he has had over the weekend was improved.  He had been eating well and drinking a lot of fluids.  He was having small loose nonbloody bowel movements.  He was not requiring use of Percocet for the last 3 days.  LABORATORY DATA:  White blood cell count 7.6, hemoglobin 16.4, hematocrit 49, MCV 86.1, and platelet count 440,000.  Sodium 138, potassium 4.1,  chloride 99, CO2 of 32, glucose 111, BUN 6, and creatinine 0.6.  Calcium 9.8.  CT SCAN: ABDOMEN/PELVIS with oral and IV contrast oon June 18, 2011 showed incidental focal area of  fatty infiltration in the liver.  Significantly, it showed sigmoid  diverticulitis with punctate foci of extraluminal air in the sigmoid  mesentery consistent with tiny perforation.  No drainable abscess or  fluid collection, no evidence of obstruction.  Also, incidentally seen, was a left-sided renal cyst.  HOSPITAL COURSE:  The patient was admitted to a regular bed and allowed a diet of clear liquids.  He was started on IV Unasyn and Flagyl. However,  the surgeons discontinued these after a day and started Invanz, which is Ertapenem.  The patient's clinical course was unremarkable. He did not use any of the Percocet for p.r.n. pain management.  Diet  was advanced to low fiber on June 20, 2011. Also on June 20, 2011,  he began Augmentin 875/125 mg, and Invanz was discontinued.  He was  discharged in stable and improved condition on June 21, 2011.  Dr. Michaell Cowing evaluated the patient for surgical consideration.  He did not feel surgery was warranted and agreed with continued antibiotics and did switch him over to the Invanz, discontinuing the Unasyn and  Flagyl.  DIET AT DISCHARGE:  Low fiber.  The surgeons had recommended that he avoid popcorn which apparently he had been eating a lot of prior to his bout of the diverticulitis.  FOLLOWUP:  Will be with Dr. Arlyce Dice around July 06, 2009.  He does have a previously scheduled appointment for June 23, 2011, but this is too soon to be seen back in the office, so the patient will switch his appointment.  MEDICATIONS AT DISCHARGE: 1. Amoxicillin Clavulanate 875 mg one p.o. b.i.d. for 10 days. 2. Bisoprolol/hydrochlorothiazide 5/6.25 mg one p.o. daily. 3. MiraLax 17 g p.o. daily as needed for constipation. 4. Omeprazole 40 mg 1 p.o. daily. 5. Percocet 5/325 1 tablet by mouth q.4 hours p.r.n. pain.     Jennye Moccasin, PA-C   ______________________________ Barbette Hair. Arlyce Dice, MD,FACG    SG/MEDQ  D:  06/21/2011  T:  06/21/2011  Job:  [id number]  Electronically Signed by Jennye Moccasin PA-C on 06/23/2011 12:24:24 PM Electronically Signed by Melvia Heaps MDFACG on 07/14/2011 08:39:14 AM

## 2011-07-14 NOTE — Progress Notes (Signed)
History of Present Illness:  Micheal Hodge has returned for followup of his acute diverticulitis. He has minimal to no abdominal l pain. Bowel habits are normalizing. Altogether he feels significantly improved.    Review of Systems: Pertinent positive and negative review of systems were noted in the above HPI section. All other review of systems were otherwise negative.    Current Medications, Allergies, Past Medical History, Past Surgical History, Family History and Social History were reviewed in [employer] [facility] electronic medical record  Vital signs were reviewed in today's medical record.

## 2011-07-14 NOTE — H&P (Signed)
Micheal Hodge, Micheal Hodge                ACCOUNT NO.:  [id number]  MEDICAL RECORD NO.:  [id number]  LOCATION:  1503                         FACILITY:  [facility]  PHYSICIAN:  Barbette Hair. Arlyce Dice, MD,FACGDATE OF BIRTH:  Mar 03, 1956  DATE OF ADMISSION:  06/18/2011 DATE OF DISCHARGE:                             HISTORY & PHYSICAL   PRIMARY CARE DOCTOR:  Micheal Hodge, M.D.  GI PRIMARY:  Barbette Hair. Arlyce Dice, MD, [facility]  REASON FOR ADMISSION:  Diverticulitis with microperforation at the sigmoid colon.  HISTORY OF PRESENT ILLNESS:  Micheal Hodge is a 55 year old white gentleman.  In 2009, he underwent EGD with dilatation of an esophageal stricture.  Also, in March 2009, he had a screening colonoscopy showing diffuse diverticulosis.  The patient was hospitalized the 5th through the 9th of August 2012 for acute diverticulitis with a CT scan showing extraluminal air consistent with a microperforation.  The patient was cared for by a surgeon at [facility]. When he was first admitted,  the surgeon had advised himthat he would require surgery.  However, the  patient did not want to undergo a two-stage procedure as the surgery  offered would have involved a temporary colostomy.  The patient, therefore,  opted for antibiotic treatment and followup with both his GI doctor and the surgeon.  After 4 days of IV antibiotics, the patient was sent home on about a 10- day course of Cipro and Flagyl.  His white blood cell count during the hospitalization had declined from 17,000 down to 11.1.  The patient was seen back at [facility] GI office on August 13.  Over the weekend that Saturday and then more so on Sunday, the 9th, the patient was experiencing more severe left-sided abdominal pain for which he was using Percocet that had been prescribed.  After Percocet, the pain would go down to 8/10.  He was having some nausea.  [name], nurse practitioner, saw him and ordered labs that day.  When they came  back, they revealed that his white blood cell count had climbed to 15.4.  She ordered a CT scan, performed midafternoon today and it shows ongoing sigmoid abscess and microperforation.  It was described as nondrainable.  The patient has been compliant with the oral Cipro and Flagyl.  He had had some chills and subjective fever on Monday after the office visit, but none today.  He says he has been eating fairly well and he has been drinking a lot of fluids.  His bowels are small volume and loose, but contain no blood.  Pain again is not severe and he has not taken any Percocet since Monday morning.  The patient is now being admitted to [facility] for IV antibiotics.  PAST MEDICAL HISTORY: 1. Hypertension. 2. Migraine headaches. 3. Dyslipidemia. 4. History of esophageal stricture dilated in 2009. 5. Diverticulosis 6. Erectile dysfunction. 7. Gastroesophageal reflux disease. 8. Hyperlipidemia.  PRIOR SURGERIES:  Include nasal septal surgery.  CURRENT MEDICATIONS: 1. Bisoprolol - hydrochlorothiazide (Ziac) 10/6.25 mg per tablet one     tablet daily. 2. Ciprofloxacin 500 mg tablet one p.o. twice daily. 3. Metronidazole 500 mg one p.o. three times daily. 4. Prilosec 40 mg 1 tablet  daily. 5. Percocet 5/325 mg 1 tablet q.4 hours p.r.n. pain.  He has not used     any since Monday.  MEDICAL ALLERGIES:  None.  FAMILY HISTORY:  He has a brother who is older and has had intestinal troubles for which he has taken antibiotic  His mother has a history of diverticulitis.  He has a maternal grandmother with a history of ovarian cancer.  Both of his parents are either medication or oral treatment diabetics.  His mother has a history of skin melanoma.  SOCIAL HISTORY:  The patient, when he is feeling well, drinks about 3 beers a day.  He is divorced.  He lives alone.  He quit smoking in 1988. 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  REVIEW OF SYSTEMS:  GENERAL:  The patient says his baseline weight is about 200 pounds and that Monday his weight had dropped down to 187 pounds when he was weighed at the [place] office.  NEUROLOGIC:  No headaches.  No paresthesia.  No history of seizures.  VISION:  His eyesight is good.  He has had no blurry vision.  ENT:  No nosebleeds. No sinus troubles.  CARDIOVASCULAR:  No chest pain.  No palpitations. RESPIRATORY:  No shortness of breath.  No cough.  GI:  No dysphagia, no heartburn, and otherwise GI review of system as outlined above.  GU: Denies oliguria, hematuria, or dysuria.  MUSCULOSKELETAL:  No joint or muscle pains.  DERMATOLOGIC:  The patient does have rosacea and has sensitive skin, but he does not take any medication for the rosacea. PSYCHIATRIC:  The patient sleeps well, has no history of depression. ENDOCRINE:  No history of diabetes or thyroid disease.  HEMATOLOGIC:  No history of transfusions.  LABORATORIES:  There are no labs from today, but labs from June 15, 2011, are as follows:  White blood cell count 15.4, hemoglobin 16.6, hematocrit 49.9.  MCV 88.  Platelets 387.  On differential, the relative neutrophils measure 80.6 and the absolute count of neutrophils is elevated at 12.4.  IMAGING STUDIES:  CT scan from today is read as sigmoid diverticulitis with punctate foci of extraluminal air within the sigmoid mesentery compatible with a tiny perforation.  There is no drainable abscess or fluid collection.  No evidence for obstruction.  There is an incidental left-sided cyst in the kidney.  PHYSICAL EXAM:  VITAL SIGNS: Temperature is 97.8, pulse is 68, blood pressure 182/117, respirations are 18, room air saturation is 98%. GENERAL: The patient looks mildly ill and tired.  He is very slightly diaphoretic. HEENT EXAM: There is no scleral icterus.  No conjunctival pallor, extraocular movements are intact.  ENT exam notable for moist  oral mucosa, good dentition, and no exudates or lesions. NECK: Supple with no masses.  No JVD. LUNGS: Clear to auscultation and percussion with no dyspnea and no cough. CARDIOVASCULAR: The heart rhythm is regular.  The rate is regular.  No murmurs, rubs, or gallops.  S1, S2 audible. ABDOMEN: Nondistended, bowel sounds are active.  There is slight tenderness in the left upper to left mid quadrant very lateral in the left side.  There is no palpable mass.  There is no guarding or rebound. EXTREMITIES: No cyanosis, clubbing, or edema. NEUROLOGIC: The patient is alert and oriented x3.  He is walking without gait disturbance.  Grossly, motor function is intact.  He is a good historian. DERMATOLOGIC: There is facial erythematous rash consistent  with rosacea.  IMPRESSION: 1. Sigmoid diverticulitis with microperforation.  This has failed to     completely improve with short course of inpatient IV antibiotics     and now several days of oral Cipro and metronidazole.  The     patient's white blood cell count is up.  CT still confirming the     microperforation without obvious abscess. 2. Hypertension.  Will need to be further monitored and continued on     his home dose of Ziac.  PLAN:  The patient will receive IV fluids, will plan to follow up labs in the morning with CBC with diff and BMET.  We will continue the Ziac and give him today's dose tonight.  Antibiotic coverage is going to be changed to Unasyn along with metronidazole both in IV formulations.  We will stop the ciprofloxacin.  For pain management, we will continue Percocet 5/325, and if he needs  it, we will have Dilaudid available p.r.n.     Jennye Moccasin, PA-C   ______________________________ Barbette Hair. Arlyce Dice, MD,FACG    SG/MEDQ  D:  06/18/2011  T:  06/19/2011  Job:  [id number]  Electronically Signed by Jennye Moccasin PA-C on 06/23/2011 12:16:08 PM Electronically Signed by Melvia Heaps MDFACG on 07/14/2011 08:39:16 AM

## 2011-07-23 ENCOUNTER — Telehealth: Payer: Self-pay | Admitting: Gastroenterology

## 2011-07-23 MED ORDER — HYOSCYAMINE SULFATE ER 0.375 MG PO TBCR: 0.3750 mg | EXTENDED_RELEASE_TABLET | Freq: Two times a day (BID) | ORAL | Status: DC | PRN

## 2011-07-23 NOTE — Telephone Encounter (Signed)
Pt states that he over ate on Sunday and has been having problems since. He states he is drinking liquids and broth but has had no solid food since Tuesday evening. He does not think he has a fever, states he is having pain below his belly button. He reports that it almost feels like his bladder is hurting but he does have a little bit of pain on his left side also. He thinks his diverticulitis is flaring. Dr. Arlyce Dice please advise.

## 2011-07-23 NOTE — Telephone Encounter (Signed)
Begin hyomax 0.375mg  bid prn C/b tomorrow if no better

## 2011-07-23 NOTE — Telephone Encounter (Signed)
Pt aware, rx sent to pharmacy. 

## 2011-07-24 ENCOUNTER — Ambulatory Visit: Payer: 59 | Admitting: Gastroenterology

## 2011-08-10 ENCOUNTER — Telehealth: Payer: Self-pay | Admitting: Gastroenterology

## 2011-08-10 MED ORDER — OMEPRAZOLE 40 MG PO CPDR: 40.0000 mg | DELAYED_RELEASE_CAPSULE | Freq: Every day | ORAL | Status: DC

## 2011-08-10 NOTE — Telephone Encounter (Signed)
Pt requesting 90 day script for omeprazole, sent rx to the pharmacy for pt.   Pt states that he is currently taking antibiotics for diverticulitis, states that he has about 1 weeks worth to take. Pt wants to know if it is ok for him to take the flu shot, they are giving it where he works. Dr. Arlyce Dice please advise.

## 2011-08-11 NOTE — Telephone Encounter (Signed)
Okay for flu shot

## 2011-08-11 NOTE — Telephone Encounter (Signed)
Pt aware.

## 2011-10-15 ENCOUNTER — Telehealth: Payer: Self-pay | Admitting: Family Medicine

## 2011-10-15 NOTE — Telephone Encounter (Signed)
Called to inform pt he needs appt to see Dr.L left message

## 2011-10-15 NOTE — Telephone Encounter (Signed)
Have him set up an appointment to see me. 

## 2011-10-20 ENCOUNTER — Encounter: Payer: Self-pay | Admitting: Family Medicine

## 2011-10-20 ENCOUNTER — Ambulatory Visit (INDEPENDENT_AMBULATORY_CARE_PROVIDER_SITE_OTHER): Payer: 59 | Admitting: Family Medicine

## 2011-10-20 VITALS — BP 140/90 | HR 70 | Wt 196.0 lb

## 2011-10-20 DIAGNOSIS — G629 Polyneuropathy, unspecified: Secondary | ICD-10-CM

## 2011-10-20 DIAGNOSIS — G609 Hereditary and idiopathic neuropathy, unspecified: Secondary | ICD-10-CM

## 2011-10-20 NOTE — Progress Notes (Signed)
  Subjective:    Patient ID: Micheal Hodge, male    DOB: [birth year], 55 y.o.   MRN: [id number]  HPI He is here for evaluation of continued difficulty with decreased sensation in his feet. This has been bothering him for quite some time. He has been evaluated in the past by orthopedics. An MRI was done. He was given an epidural which only minimally helped with the back pain but had no effect on his decreased sensation. Blood work was also noncontributory.   Review of Systems     Objective:   Physical Exam Alert and in no distress. Ankle reflexes as well as knee reflexes are diminished. Micheal Hodge sensation intact. Decreased sensory over the plantar surface there the metatarsals and over the digits. Clonus was negative.       Assessment & Plan:  Peripheral neuropathy, etiology unclear. I will refer to neurology for further evaluation.

## 2011-10-20 NOTE — Patient Instructions (Signed)
My nurse will call you with the appointment with the neurologist

## 2011-11-27 ENCOUNTER — Telehealth (INDEPENDENT_AMBULATORY_CARE_PROVIDER_SITE_OTHER): Payer: Self-pay | Admitting: General Surgery

## 2011-11-27 ENCOUNTER — Ambulatory Visit (INDEPENDENT_AMBULATORY_CARE_PROVIDER_SITE_OTHER): Payer: 59 | Admitting: Surgery

## 2011-11-27 NOTE — Telephone Encounter (Signed)
Left message on pts VM at work and on mobile to RS appt today due to inclement weather.

## 2011-12-04 ENCOUNTER — Ambulatory Visit (INDEPENDENT_AMBULATORY_CARE_PROVIDER_SITE_OTHER): Payer: 59 | Admitting: Surgery

## 2011-12-04 ENCOUNTER — Encounter (INDEPENDENT_AMBULATORY_CARE_PROVIDER_SITE_OTHER): Payer: Self-pay | Admitting: Surgery

## 2011-12-04 VITALS — BP 158/92 | HR 68 | Temp 98.8°F | Resp 18 | Ht 73.0 in | Wt 197.4 lb

## 2011-12-04 DIAGNOSIS — K5792 Diverticulitis of intestine, part unspecified, without perforation or abscess without bleeding: Secondary | ICD-10-CM

## 2011-12-04 DIAGNOSIS — K5732 Diverticulitis of large intestine without perforation or abscess without bleeding: Secondary | ICD-10-CM

## 2011-12-04 NOTE — Patient Instructions (Signed)
Followup after BE

## 2011-12-04 NOTE — Progress Notes (Signed)
Chief Complaint:  Recurrent diverticulitis  History of Present Illness:  Micheal Hodge is an 56 y.o. male who I saw in the hospital last summer with diverticulitis. He has a history of bowel complaints labeled spastic colon going back to high school. He had a microperforation noted Bactroban which has gotten better but he says that his symptoms have never gone back to normal. Despite changing his diet avoid popcorn and nuts which heretofore he had taken on a regular basis he says he still has problems with bloating and lower abdominal pain. He is willing to entertain surgery at this time. Prior to moving forward with surgery, I would like to obtain a barium enema. We'll schedule this in and see him back in the office to discuss potential surgery.  Past Medical History  Diagnosis Date  . Rosacea   . Migraine headache   . Allergy   . Hypertension   . Esophageal stricture   . Diverticulosis   . ED (erectile dysfunction)   . GERD (gastroesophageal reflux disease)   . Hyperlipidemia   . Diverticulitis     Past Surgical History  Procedure Date  . Upper gastrointestinal endoscopy 2009  . Colonoscopy 2009  . Nasal septum surgery     Current Outpatient Prescriptions  Medication Sig Dispense Refill  . bisoprolol-hydrochlorothiazide (ZIAC) 10-6.25 MG per tablet Take 1 tablet by mouth daily.  30 tablet  11  . omeprazole (PRILOSEC) 40 MG capsule Take 1 capsule (40 mg total) by mouth daily.  90 capsule  3   Review of patient's allergies indicates no known allergies. Family History  Problem Relation Age of Onset  . Diabetes Mother   . Hyperlipidemia Mother   . Heart disease Mother     stents  . Cancer Mother     Melanoma  . Hypertension Father    Social History:   reports that he quit smoking about 25 years ago. He has never used smokeless tobacco. He reports that he drinks about 12 ounces of alcohol per week. He reports that he does not use illicit drugs.   REVIEW OF SYSTEMS -  PERTINENT POSITIVES ONLY: Is positive for sore throat, constipation and occasional diarrhea. Family history is positive for mother with melanoma. His only surgical procedure was nasal surgery in 1988. In addition he has problems with high cholesterol.  Physical Exam:   Blood pressure 158/92, pulse 68, temperature 98.8 F (37.1 C), temperature source Temporal, resp. rate 18, height 6\' 1"  (1.854 m), weight 197 lb 6.4 oz (89.54 kg). Body mass index is 26.04 kg/(m^2).  Gen:  WDWN white male NAD  Neurological: Alert and oriented to person, place, and time. Motor and sensory function is grossly intact  Head: Normocephalic and atraumatic.  Eyes: Conjunctivae are normal. Pupils are equal, round, and reactive to light. No scleral icterus.  Neck: Normal range of motion. Neck supple. No tracheal deviation or thyromegaly present.  Cardiovascular:  SR without murmurs or gallops.  No carotid bruits Respiratory: Effort normal.  No respiratory distress. No chest wall tenderness. Breath sounds normal.  No wheezes, rales or rhonchi.  Abdomen:  Soft and nontender. GU: Musculoskeletal: Normal range of motion. Extremities are nontender. No cyanosis, edema or clubbing noted Lymphadenopathy: No cervical, preauricular, postauricular or axillary adenopathy is present Skin: Skin is warm and dry. No rash noted. No diaphoresis. No erythema. No pallor. Pscyh: Normal mood and affect. Behavior is normal. Judgment and thought content normal.   LABORATORY RESULTS: No results found for this or  any previous visit (from the past 48 hour(s)).  RADIOLOGY RESULTS: No results found.  Problem List: Patient Active Problem List  Diagnoses  . GERD  . DYSPHAGIA UNSPECIFIED  . DYSPHAGIA  . ABDOMINAL PAIN, LEFT UPPER QUADRANT  . Constipation  . Diverticulitis large intestine  . Internal hemorrhoids with other complication    Assessment & Plan: History of diverticulitis with micro-perforate a long history of "irritable  bowel syndrome"  Plan obtain barium enema and consider for elective sigmoid colectomy. I gave him a booklet on this procedure and on diverticulitis and at this crowd the procedure in some detail. Return after B.    Matt B. Daphine Deutscher, MD, [facility] [phone] beeper [phone]  12/04/2011 10:24 AM

## 2011-12-08 ENCOUNTER — Telehealth: Payer: Self-pay | Admitting: Family Medicine

## 2011-12-08 ENCOUNTER — Ambulatory Visit (HOSPITAL_COMMUNITY)
Admission: RE | Admit: 2011-12-08 | Discharge: 2011-12-08 | Disposition: A | Discharge status: Home / Self Care | Payer: 59 | Source: Ambulatory Visit | Attending: Surgery | Admitting: Surgery

## 2011-12-08 DIAGNOSIS — K5792 Diverticulitis of intestine, part unspecified, without perforation or abscess without bleeding: Secondary | ICD-10-CM

## 2011-12-08 DIAGNOSIS — K5730 Diverticulosis of large intestine without perforation or abscess without bleeding: Secondary | ICD-10-CM | POA: Insufficient documentation

## 2011-12-08 NOTE — Telephone Encounter (Signed)
DONE

## 2011-12-14 ENCOUNTER — Telehealth (INDEPENDENT_AMBULATORY_CARE_PROVIDER_SITE_OTHER): Payer: Self-pay | Admitting: General Surgery

## 2011-12-14 NOTE — Telephone Encounter (Signed)
Left a message for the patient to advise him of the results to his Barium enema. Dr Daphine Deutscher suggested that patient make a follow up appt to discuss the results.

## 2012-02-03 ENCOUNTER — Ambulatory Visit (INDEPENDENT_AMBULATORY_CARE_PROVIDER_SITE_OTHER): Payer: 59 | Admitting: Surgery

## 2012-02-03 ENCOUNTER — Encounter (INDEPENDENT_AMBULATORY_CARE_PROVIDER_SITE_OTHER): Payer: Self-pay | Admitting: Surgery

## 2012-02-03 VITALS — BP 153/93 | HR 72 | Temp 98.9°F | Ht 73.0 in | Wt 199.0 lb

## 2012-02-03 DIAGNOSIS — K5730 Diverticulosis of large intestine without perforation or abscess without bleeding: Secondary | ICD-10-CM | POA: Insufficient documentation

## 2012-02-03 NOTE — Progress Notes (Signed)
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  This study was done on February this fifth and he has been able to get in to see me before now. He was confused about what we wanted to her where I recommended. A pull that this scan and reviewed it with him. He does have scattered tics throughout his colon with a preponderance in the sigmoid colon. Currently he is feeling great and for the last several weeks after starting prunes he is not having any pain. I think that these circumstances we should try to manage disease nonoperatively. I discussed the measures including higher fiber diet, hydration, exercise. He will try these. Should he began having more symptoms he'll come back and we can discuss elective surgery.

## 2012-05-26 ENCOUNTER — Telehealth: Payer: Self-pay | Admitting: Internal Medicine

## 2012-05-26 MED ORDER — BISOPROLOL-HYDROCHLOROTHIAZIDE 10-6.25 MG PO TABS: 1.0000 | ORAL_TABLET | Freq: Every day | ORAL | Status: DC

## 2012-05-26 NOTE — Telephone Encounter (Signed)
Blood pressure meds called in

## 2012-09-03 IMAGING — CT CT ABD-PELV W/ CM
1 of 2 series · 15 of 32 positions shown, 19 images · IV contrast (isovue)
Comparison: none

REASON FOR EXAM: (1) LLQ pain; (2) LLQ pain;    NOTE: Nursing to Give
Oral CT Contrast
COMMENTS:   May transport without cardiac monitor

PROCEDURE:     CT  - CT ABDOMEN / PELVIS  W  - June 07, 2011  [DATE]
RESULT:     Comparison:  None
TECHNIQUE: Multiple axial images of the abdomen and pelvis were performed
from the lung bases to the pubic symphysis, with p.o. contrast and with 100
mL of Isovue 370 intravenous contrast.

[Series 2: appendicitis · axial · 0.68mm/px · z∈[-966,-516]mm · 15 of 164 slices shown, 19 images]
[im 7/164  soft-tissue]
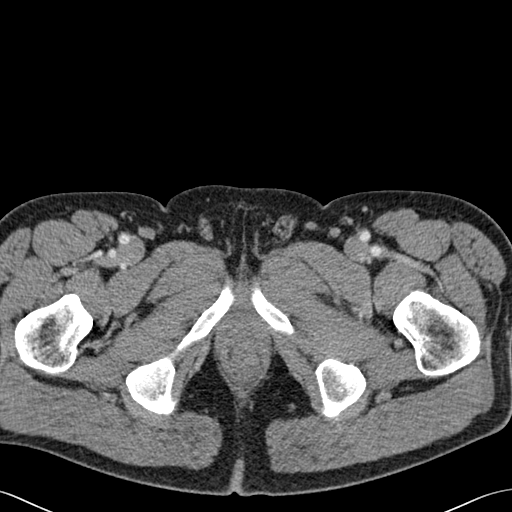
[im 7/164  bone]
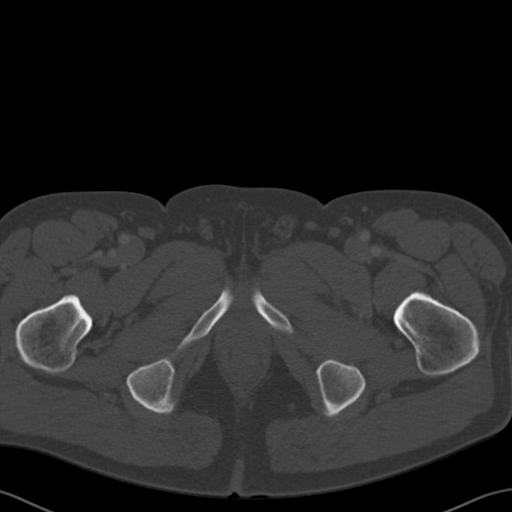
[im 20/164  soft-tissue]
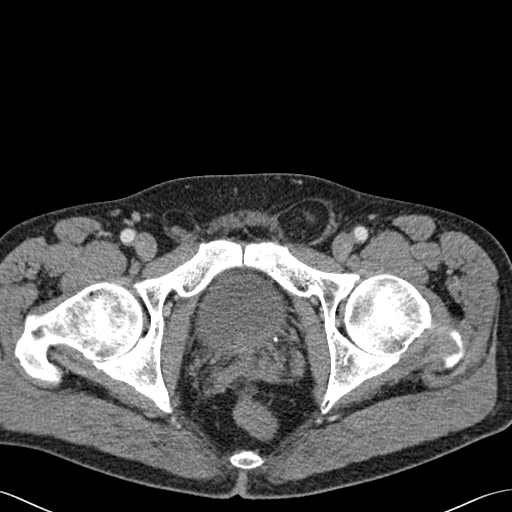
[im 33/164  soft-tissue]
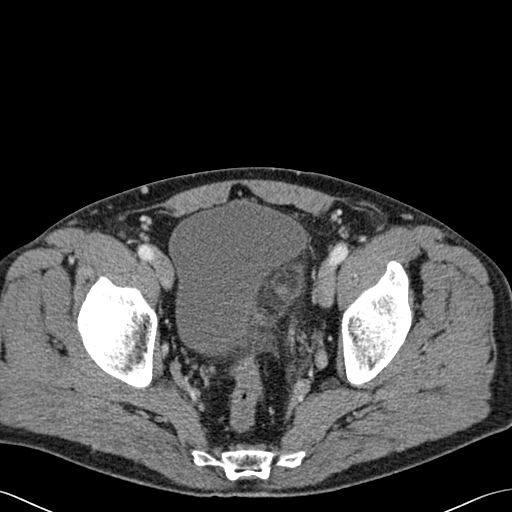
[im 46/164  soft-tissue]
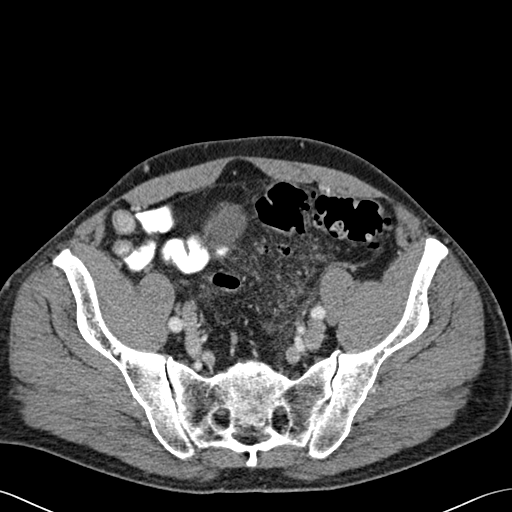
[im 59/164  soft-tissue]
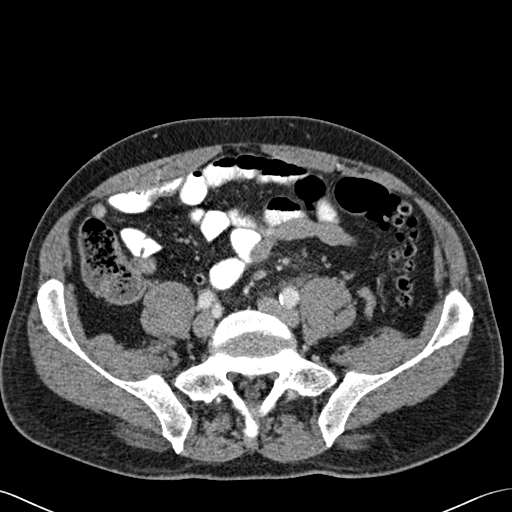
[im 72/164  soft-tissue]
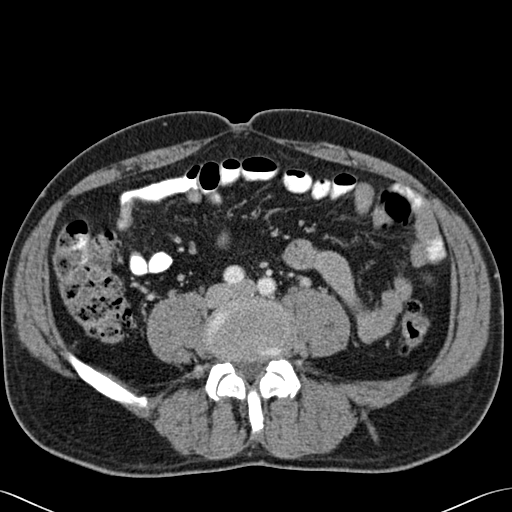
[im 85/164  soft-tissue]
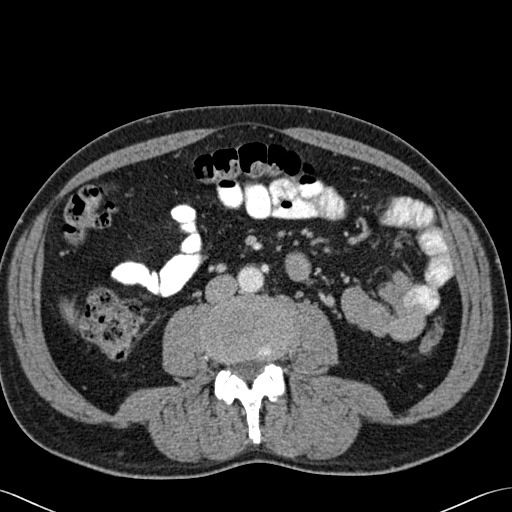
[im 92/164  soft-tissue]
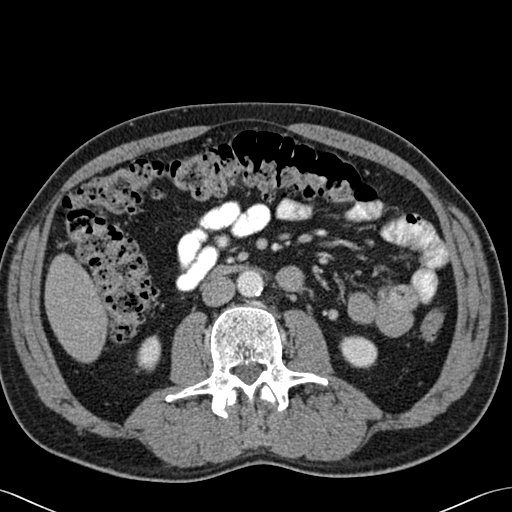
[im 105/164  soft-tissue]
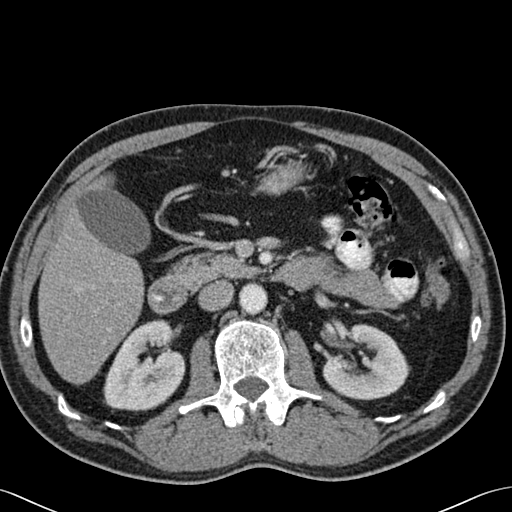
[im 105/164  bone]
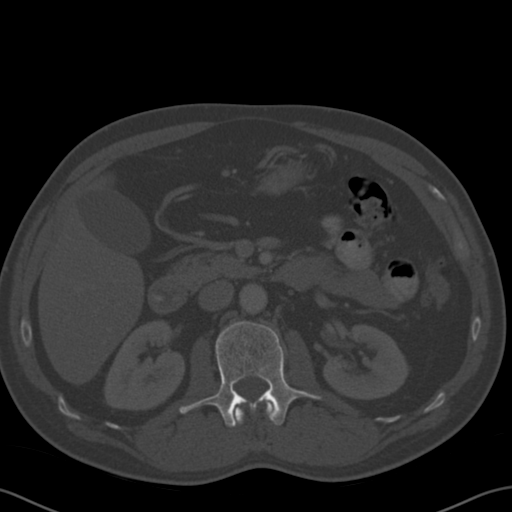
[im 118/164  soft-tissue]
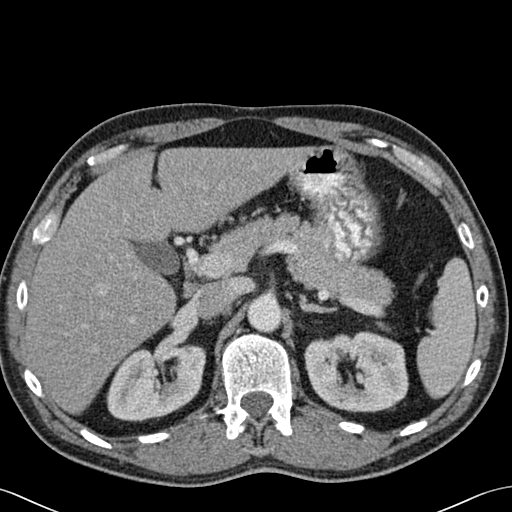
[im 131/164  soft-tissue]
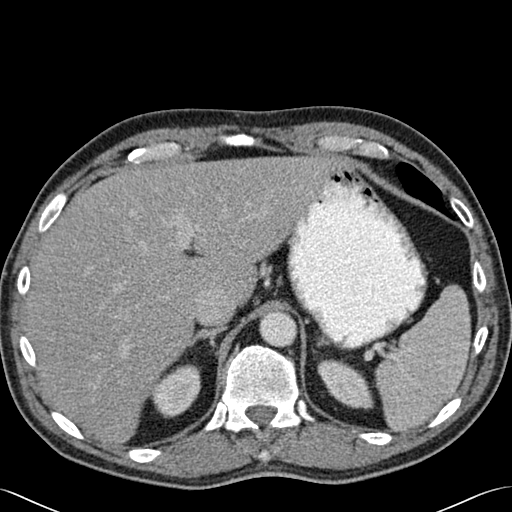
[im 137/164  lung]
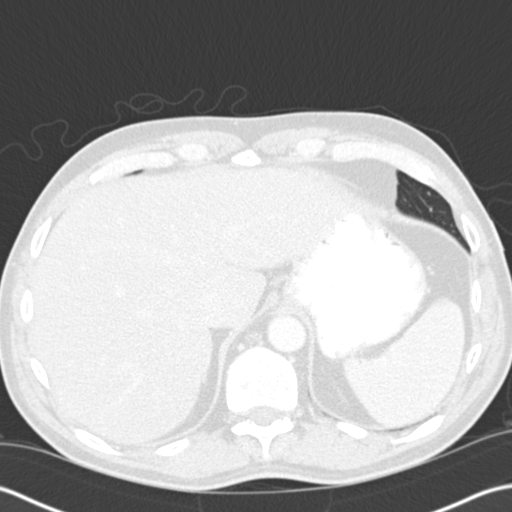
[im 144/164  soft-tissue]
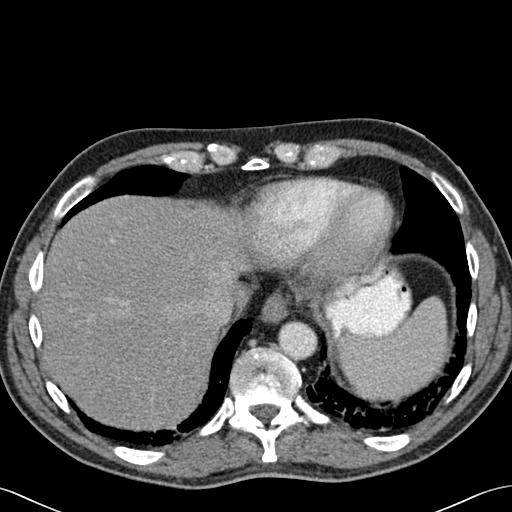
[im 144/164  lung]
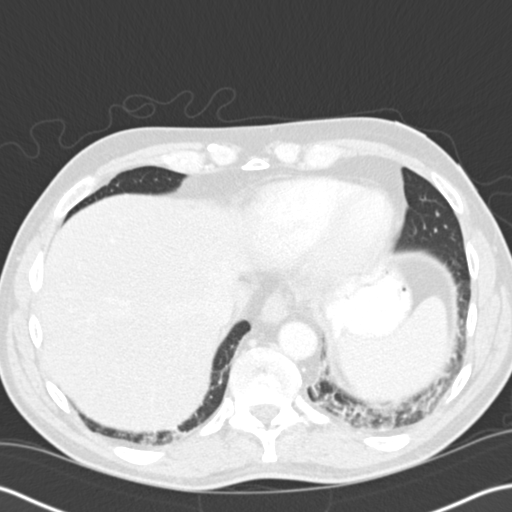
[im 150/164  lung]
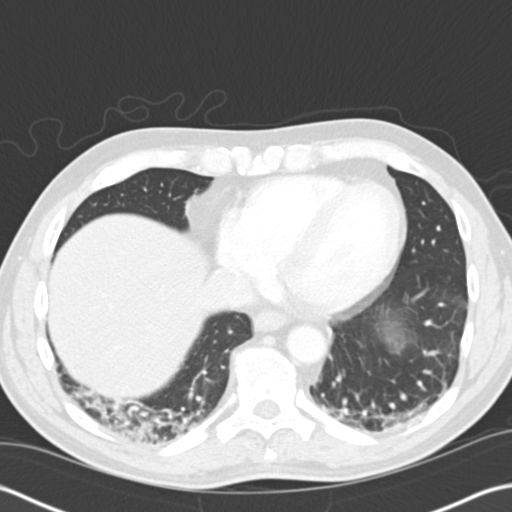
[im 157/164  soft-tissue]
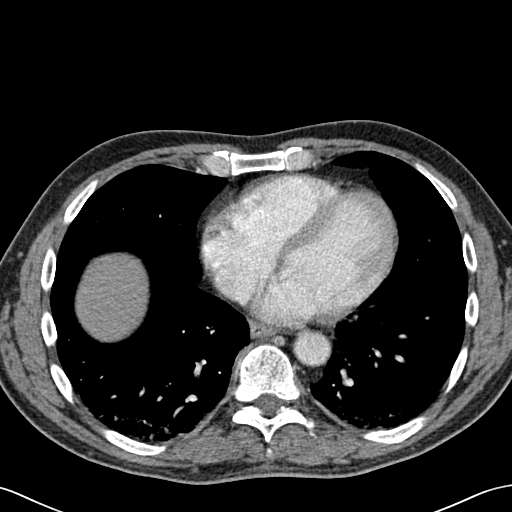
[im 157/164  lung]
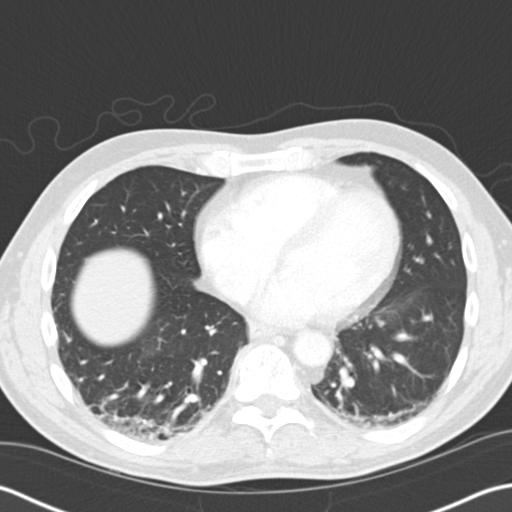

[15 of 32 positions shown; findings below may reference images not displayed]

FINDINGS: Mild dependent pulmonary opacities likely represent atelectasis.

The liver, gallbladder, spleen, adrenals, and pancreas are unremarkable.
Low-attenuation lesion in the left kidney most likely represents a cyst. No
hydronephrosis.

The small and large bowel are normal in caliber. There is diverticulosis of
the sigmoid colon. There is bowel wall thickening and adjacent inflammatory
stranding of the sigmoid colon. There are several foci of extraluminal air
adjacent to one of the diverticula, concerning for microperforation. No
discrete fluid collection identified. The appendix is normal. There is a fat
containing left inguinal hernia. There is mild prominent fat in the right
inguinal canal.

No aggressive lytic or sclerotic osseous lesions identified.
IMPRESSION: Acute sigmoid diverticulitis. There are foci of extraluminal air concerning
for microperforation. No discrete fluid collection identified.

Followup with direct visualization is suggested after the acute episode to
exclude underlying malignancy.

## 2012-11-21 ENCOUNTER — Other Ambulatory Visit: Payer: Self-pay | Admitting: Family Medicine

## 2012-12-05 ENCOUNTER — Encounter: Payer: Self-pay | Admitting: Family Medicine

## 2012-12-05 ENCOUNTER — Ambulatory Visit (INDEPENDENT_AMBULATORY_CARE_PROVIDER_SITE_OTHER): Payer: 59 | Admitting: Family Medicine

## 2012-12-05 VITALS — BP 138/90 | HR 76 | Wt 208.0 lb

## 2012-12-05 DIAGNOSIS — G609 Hereditary and idiopathic neuropathy, unspecified: Secondary | ICD-10-CM

## 2012-12-05 DIAGNOSIS — I10 Essential (primary) hypertension: Secondary | ICD-10-CM | POA: Insufficient documentation

## 2012-12-05 DIAGNOSIS — Z79899 Other long term (current) drug therapy: Secondary | ICD-10-CM

## 2012-12-05 DIAGNOSIS — K219 Gastro-esophageal reflux disease without esophagitis: Secondary | ICD-10-CM

## 2012-12-05 DIAGNOSIS — K5730 Diverticulosis of large intestine without perforation or abscess without bleeding: Secondary | ICD-10-CM

## 2012-12-05 DIAGNOSIS — Z8719 Personal history of other diseases of the digestive system: Secondary | ICD-10-CM

## 2012-12-05 DIAGNOSIS — G629 Polyneuropathy, unspecified: Secondary | ICD-10-CM | POA: Insufficient documentation

## 2012-12-05 DIAGNOSIS — Z23 Encounter for immunization: Secondary | ICD-10-CM

## 2012-12-05 LAB — COMPREHENSIVE METABOLIC PANEL
ALT: 32 U/L (ref 0–53)
AST: 23 U/L (ref 0–37)
Albumin: 4.6 g/dL (ref 3.5–5.2)
Alkaline Phosphatase: 100 U/L (ref 39–117)
Chloride: 99 mEq/L (ref 96–112)
Potassium: 4.3 mEq/L (ref 3.5–5.3)
Sodium: 136 mEq/L (ref 135–145)
Total Protein: 7.3 g/dL (ref 6.0–8.3)

## 2012-12-05 LAB — LIPID PANEL
HDL: 43 mg/dL (ref >39)
Triglycerides: 474 mg/dL — ABNORMAL HIGH (ref <150)

## 2012-12-05 LAB — CBC WITH DIFFERENTIAL/PLATELET
Basophils Absolute: 0 10*3/uL (ref 0.0–0.1)
Basophils Relative: 0 % (ref 0–1)
Hemoglobin: 16.2 g/dL (ref 13.0–17.0)
Lymphocytes Relative: 28 % (ref 12–46)
MCHC: 35.8 g/dL (ref 30.0–36.0)
Neutro Abs: 5.6 10*3/uL (ref 1.7–7.7)
Neutrophils Relative %: 60 % (ref 43–77)
RDW: 14 % (ref 11.5–15.5)
WBC: 9.3 10*3/uL (ref 4.0–10.5)

## 2012-12-05 MED ORDER — OMEPRAZOLE 40 MG PO CPDR: 40.0000 mg | DELAYED_RELEASE_CAPSULE | Freq: Every day | ORAL | Status: DC

## 2012-12-05 MED ORDER — BISOPROLOL-HYDROCHLOROTHIAZIDE 10-6.25 MG PO TABS: 1.0000 | ORAL_TABLET | Freq: Every day | ORAL | Status: DC

## 2012-12-05 NOTE — Progress Notes (Signed)
  Subjective:    Patient ID: Micheal Hodge, male    DOB: [birth year], 57 y.o.   MRN: [id number]  HPI He is here for an interval evaluation. He does need refills on some of his medications. He does have a history of reflux disease as well as esophageal stricture. He continues on Prilosec. He was evaluated within the last year for diverticulitis however at this time he is having no difficulty with abdominal pain. He continues on his blood pressure medication. He does have a history of peripheral neuropathy and does have decreased sensation in both lower feet. He also mentions the fact that his father has the same issue. At this time he is on no medication for his neuropathy. His immunizations were reviewed.   Review of Systems     Objective:   Physical Exam alert and in no distress. Tympanic membranes and canals are normal. Throat is clear. Tonsils are normal. Neck is supple without adenopathy or thyromegaly. Cardiac exam shows a regular sinus rhythm without murmurs or gallops. Lungs are clear to auscultation. Reflexes are diminished in both lower extremities at the knees and ankles with decreased sensation in his feet.      Assessment & Plan:   1. Diverticulosis of colon-tics scattered throughout with preponderance in sigmoid    2. GERD  omeprazole (PRILOSEC) 40 MG capsule  3. Hypertension  bisoprolol-hydrochlorothiazide (ZIAC) 10-6.25 MG per tablet  4. History of esophageal stricture    5. Peripheral neuropathy    6. Encounter for long-term (current) use of other medications  Tdap vaccine greater than or equal to 7yo IM, Lipid panel, CBC with Differential, Comprehensive metabolic panel   TDaP given with risks and benefits discussed. Also discussed his neuropathy. Strongly encouraged him to visually inspect his feet regularly. Also discussed the fact that he should not walk around barefoot. Will renew his medications. Recommend he return for complete examination in the near future. Some  discussed diverticulosis and symptoms to watch out for.

## 2012-12-06 NOTE — Progress Notes (Signed)
Quick Note:  Needs followup on his lipid panel ______

## 2012-12-08 NOTE — Progress Notes (Signed)
Quick Note:  CALLED PT HOME # PT INFORMED AND HAS APT FEB 10 AT 11;30 ______

## 2012-12-12 ENCOUNTER — Ambulatory Visit (INDEPENDENT_AMBULATORY_CARE_PROVIDER_SITE_OTHER): Payer: 59 | Admitting: Family Medicine

## 2012-12-12 DIAGNOSIS — E785 Hyperlipidemia, unspecified: Secondary | ICD-10-CM

## 2012-12-12 NOTE — Progress Notes (Signed)
  Subjective:    Patient ID: Micheal Hodge, male    DOB: 30-Apr-1956, 57 y.o.   MRN: [id number]  HPI He is here for consult concerning elevated triglycerides. Further discussion with him indicates this was not a fasting reading. He also admits to drinking usually 3 beverages per night but says that he cut back on this approximately 2 weeks prior to the blood being drawn.   Review of Systems     Objective:   Physical Exam Alert and in no distress otherwise not examined       Assessment & Plan:   1. Other and unspecified hyperlipidemia  Lipid panel   Lipid panel  I discussed elevated triglycerides in regard to diet as well as alcohol consumption. Since he has been alcohol free for several weeks, he will continue with this. Also discussed triglycerides in regard to diet. He will make an effort to change his diet and recheck this in approximately one month.

## 2012-12-17 ENCOUNTER — Other Ambulatory Visit: Payer: Self-pay

## 2012-12-22 ENCOUNTER — Encounter: Payer: Self-pay | Admitting: Family Medicine

## 2013-01-09 ENCOUNTER — Other Ambulatory Visit: Payer: 59

## 2013-01-09 DIAGNOSIS — E785 Hyperlipidemia, unspecified: Secondary | ICD-10-CM

## 2013-01-10 LAB — LIPID PANEL
LDL Cholesterol: 120 mg/dL — ABNORMAL HIGH (ref 0–99)
Triglycerides: 264 mg/dL — ABNORMAL HIGH (ref <150)
VLDL: 53 mg/dL — ABNORMAL HIGH (ref 0–40)

## 2013-01-10 NOTE — Progress Notes (Signed)
Quick Note:  Called pt he was informed and verbalized understanding ______

## 2013-06-05 ENCOUNTER — Other Ambulatory Visit: Payer: Self-pay | Admitting: Family Medicine

## 2013-09-07 ENCOUNTER — Other Ambulatory Visit: Payer: Self-pay

## 2013-12-12 ENCOUNTER — Other Ambulatory Visit: Payer: Self-pay | Admitting: Gastroenterology

## 2014-01-11 ENCOUNTER — Encounter: Payer: Self-pay | Admitting: Family Medicine

## 2014-01-11 ENCOUNTER — Ambulatory Visit (INDEPENDENT_AMBULATORY_CARE_PROVIDER_SITE_OTHER): Payer: 59 | Admitting: Family Medicine

## 2014-01-11 VITALS — BP 140/94 | HR 80 | Temp 98.5°F | Ht 72.0 in | Wt 208.0 lb

## 2014-01-11 DIAGNOSIS — J069 Acute upper respiratory infection, unspecified: Secondary | ICD-10-CM

## 2014-01-11 DIAGNOSIS — I10 Essential (primary) hypertension: Secondary | ICD-10-CM

## 2014-01-11 NOTE — Progress Notes (Signed)
Chief Complaint  Patient presents with  . Nasal Congestion    started this Tuesday with nasal congestion. Has a sinus headache-has a slight cough -not really productive. No color to his mucus. DId not go to work yesterday or today-wants to know if he is contagious or not.    2 days ago he "didn't feel quite right", but not too bad.  Had some sneezing, was told he looked flushed.  Yesterday he woke up feeling much worse--head congestion.  Nose is runny, mucus is clear. Mucus is very thick. He is having mild sore throat, some postnasal drainage.  He has pain on the right side of his head.  Pain is across the forehead on both sides, and spreads around to the left cheek.  He is coughing some, not productive, not keeping him awake at night.    He stayed home yesterday, worried about getting others at work sick He feels clammy, but no known fevers or chills.  +sick contacts at work  Hasn't taken any OTC medications.  Past Medical History  Diagnosis Date  . Rosacea   . Migraine headache   . Allergy   . Hypertension   . Esophageal stricture   . Diverticulosis   . ED (erectile dysfunction)   . GERD (gastroesophageal reflux disease)   . Hyperlipidemia   . Diverticulitis    Past Surgical History  Procedure Laterality Date  . Upper gastrointestinal endoscopy  2009  . Colonoscopy  2009  . Nasal septum surgery     History   Social History  . Marital Status: Divorced    Spouse Name: N/A    Number of Children: 1  . Years of Education: N/A   Occupational History  . 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     Social History Main Topics  . Smoking status: Former Smoker -- 15 years    Quit date: 11/02/1986  . Smokeless tobacco: Never Used  . Alcohol Use: 12.0 oz/week    20 Cans of beer per week     Comment: 1 drink per day.  . Drug Use: No  . Sexual Activity: Not on file   Other Topics Concern  . Not on file   Social History Narrative  . No narrative on file    Outpatient Encounter Prescriptions as of 01/11/2014  Medication Sig  . bisoprolol-hydrochlorothiazide (ZIAC) 10-6.25 MG per tablet TAKE ONE TABLET BY MOUTH EVERY DAY  . omeprazole (PRILOSEC) 40 MG capsule TAKE ONE CAPSULE BY MOUTH EVERY DAY   No Known Allergies  ROS:  +headache.  No fevers, chills.  No vomiting, diarrhea.  Mild nausea (related to the PND). No bleeding, bruising, rashes, chest pain, shortness of breath, myalgias, arthralgias or other complaints except as per HPI.  PHYSICAL EXAM: BP 142/102  Pulse 80  Temp(Src) 98.5 F (36.9 C) (Oral)  Ht 6' (1.829 m)  Wt 208 lb (94.348 kg)  BMI 28.20 kg/m2 BP 140/94 on repeat by MD, RA Well developed, pleasant male in no distress.  No cough during visit, appears tired HEENT:  PERRL, EOMI, conjunctiva clear.  Tm's and EAC's normal.  Nasal mucosa only minimally edematous, no erythema, some clear stringy mucus.  Tender over frontal sinuses bilaterally, and over the left temporalis muscle. nontender over temporal artery.  OP clear Neck: no lymphadenopathy or mass Heart: regular rate and rhythm without murmur Lungs: clear bilaterally Skin: no rash Neuro: alert ad oriented.  Normal cranial nerves, strength, gait   ASSESSMENT/PLAN:  Acute upper  respiratory infections of unspecified site - supportive measures reviewed in detail.  s/sx of infection also reviewed.  call next week if symptoms persist/worsen  Hypertension - elevated today, likely contributed by headache.  check periodically and ensure that BP comes down. avoid decongestants   Drink plenty of fluids. Use claritin, zyrtec or coricidin HBP as needed for head congestion Use sinus rinses once or twice daily Guaifenesin is the expectorant in Mucinex and Robitussin--this will help keep the mucus thin (and drain better from the sinuses, and help with any chest congestion) Dextromethorphan is a cough suppressant that you can use if needed--this is found in the DM versions of  robitussin and Mucinex, or can be found separately in Delsym syrup.  I recommend using ibuprofen and/or tylenol for headache and pain  Call next week if symptoms are worsening, rather than improving (fevers, discolored mucus).  Please return for re-evaluation if having shortness of breath, worsening cough.  Please periodically check your blood pressure.  You probably should check it in the next few days, especially after treating your headache (ie tylenol or ibuprofen)

## 2014-01-11 NOTE — Patient Instructions (Signed)
  Drink plenty of fluids. Use claritin, zyrtec or coricidin HBP as needed for head congestion Use sinus rinses once or twice daily Guaifenesin is the expectorant in Mucinex and Robitussin--this will help keep the mucus thin (and drain better from the sinuses, and help with any chest congestion) Dextromethorphan is a cough suppressant that you can use if needed--this is found in the DM versions of robitussin and Mucinex, or can be found separately in Delsym syrup.  I recommend using ibuprofen and/or tylenol for headache and pain  Call next week if symptoms are worsening, rather than improving (fevers, discolored mucus).  Please return for re-evaluation if having shortness of breath, worsening cough.  Please periodically check your blood pressure.  You probably should check it in the next few days, especially after treating your headache (ie tylenol or ibuprofen)

## 2014-05-15 ENCOUNTER — Ambulatory Visit (INDEPENDENT_AMBULATORY_CARE_PROVIDER_SITE_OTHER): Payer: 59 | Admitting: Family Medicine

## 2014-05-15 ENCOUNTER — Encounter: Payer: Self-pay | Admitting: Family Medicine

## 2014-05-15 VITALS — BP 140/90 | HR 66 | Wt 204.0 lb

## 2014-05-15 DIAGNOSIS — E785 Hyperlipidemia, unspecified: Secondary | ICD-10-CM

## 2014-05-15 DIAGNOSIS — Z8719 Personal history of other diseases of the digestive system: Secondary | ICD-10-CM

## 2014-05-15 DIAGNOSIS — I10 Essential (primary) hypertension: Secondary | ICD-10-CM

## 2014-05-15 DIAGNOSIS — L259 Unspecified contact dermatitis, unspecified cause: Secondary | ICD-10-CM

## 2014-05-15 DIAGNOSIS — K219 Gastro-esophageal reflux disease without esophagitis: Secondary | ICD-10-CM

## 2014-05-15 LAB — COMPREHENSIVE METABOLIC PANEL
ALT: 21 U/L (ref 0–53)
AST: 17 U/L (ref 0–37)
Albumin: 4.5 g/dL (ref 3.5–5.2)
Alkaline Phosphatase: 114 U/L (ref 39–117)
BILIRUBIN TOTAL: 0.4 mg/dL (ref 0.2–1.2)
BUN: 14 mg/dL (ref 6–23)
CALCIUM: 9.7 mg/dL (ref 8.4–10.5)
CHLORIDE: 100 meq/L (ref 96–112)
CO2: 29 meq/L (ref 19–32)
CREATININE: 0.96 mg/dL (ref 0.50–1.35)
Glucose, Bld: 100 mg/dL — ABNORMAL HIGH (ref 70–99)
Potassium: 4.6 mEq/L (ref 3.5–5.3)
Sodium: 137 mEq/L (ref 135–145)
Total Protein: 7.3 g/dL (ref 6.0–8.3)

## 2014-05-15 LAB — CBC WITH DIFFERENTIAL/PLATELET
BASOS ABS: 0 10*3/uL (ref 0.0–0.1)
Basophils Relative: 0 % (ref 0–1)
EOS ABS: 0.3 10*3/uL (ref 0.0–0.7)
EOS PCT: 3 % (ref 0–5)
HEMATOCRIT: 45 % (ref 39.0–52.0)
Hemoglobin: 15.8 g/dL (ref 13.0–17.0)
LYMPHS ABS: 2.4 10*3/uL (ref 0.7–4.0)
Lymphocytes Relative: 25 % (ref 12–46)
MCH: 28.9 pg (ref 26.0–34.0)
MCHC: 35.1 g/dL (ref 30.0–36.0)
MCV: 82.3 fL (ref 78.0–100.0)
MONO ABS: 0.9 10*3/uL (ref 0.1–1.0)
Monocytes Relative: 9 % (ref 3–12)
Neutro Abs: 6 10*3/uL (ref 1.7–7.7)
Neutrophils Relative %: 63 % (ref 43–77)
PLATELETS: 329 10*3/uL (ref 150–400)
RBC: 5.47 MIL/uL (ref 4.22–5.81)
RDW: 13.8 % (ref 11.5–15.5)
WBC: 9.5 10*3/uL (ref 4.0–10.5)

## 2014-05-15 LAB — LIPID PANEL
CHOL/HDL RATIO: 5.7 ratio
Cholesterol: 224 mg/dL — ABNORMAL HIGH (ref 0–200)
HDL: 39 mg/dL — AB (ref >39)
Triglycerides: 558 mg/dL — ABNORMAL HIGH (ref <150)

## 2014-05-15 MED ORDER — BISOPROLOL-HYDROCHLOROTHIAZIDE 10-6.25 MG PO TABS: ORAL_TABLET | ORAL | Status: DC

## 2014-05-15 MED ORDER — AMLODIPINE BESYLATE 5 MG PO TABS: 5.0000 mg | ORAL_TABLET | Freq: Every day | ORAL | Status: DC

## 2014-05-15 NOTE — Progress Notes (Signed)
   Subjective:    Patient ID: Micheal Hodge, male    DOB: Jan 23, 1956, 58 y.o.   MRN: [id number]  HPI He is here for a med check. He continues to do quite nicely. He has had some recent difficulty with contact dermatitis. This started several days ago and he is using OTC meds for this. He is intermittently using his PPI for control of his reflux disease. He does have a previous history of esophageal stricture and has had a dilatation on 2 occasions. His work is going well. He is not dating anyone. His son is now living on his own and seems to be doing fairly well.   Review of Systems     Objective:   Physical Exam alert and in no distress. Tympanic membranes and canals are normal. Throat is clear. Tonsils are normal. Neck is supple without adenopathy or thyromegaly. Cardiac exam shows a regular sinus rhythm without murmurs or gallops. Lungs are clear to auscultation.        Assessment & Plan:  Essential hypertension - Plan: CBC with Differential, Comprehensive metabolic panel, amLODipine (NORVASC) 5 MG tablet, bisoprolol-hydrochlorothiazide (ZIAC) 10-6.25 MG per tablet  History of esophageal stricture  Gastroesophageal reflux disease without esophagitis  Contact dermatitis  Hyperlipidemia LDL goal <100 - Plan: Lipid panel  recommend continued conservative care for the contact dermatitis. Continue present medications and add Norvasc since his blood pressure is not under good control. Also encouraged him to use Prilosec on a regular basis especially with history of esophageal stricture recommending that he not wait for symptoms to occur.

## 2014-05-15 NOTE — Patient Instructions (Signed)
Use cortisone cream on the rash. If you have problems with the new medication, give me a call

## 2014-06-18 ENCOUNTER — Ambulatory Visit (INDEPENDENT_AMBULATORY_CARE_PROVIDER_SITE_OTHER): Payer: 59 | Admitting: Family Medicine

## 2014-06-18 ENCOUNTER — Encounter: Payer: Self-pay | Admitting: Family Medicine

## 2014-06-18 VITALS — BP 136/88 | HR 64 | Wt 201.0 lb

## 2014-06-18 DIAGNOSIS — Z1159 Encounter for screening for other viral diseases: Secondary | ICD-10-CM

## 2014-06-18 DIAGNOSIS — I10 Essential (primary) hypertension: Secondary | ICD-10-CM

## 2014-06-18 MED ORDER — AMLODIPINE BESYLATE 5 MG PO TABS: 5.0000 mg | ORAL_TABLET | Freq: Every day | ORAL | Status: DC

## 2014-06-18 MED ORDER — BISOPROLOL-HYDROCHLOROTHIAZIDE 10-6.25 MG PO TABS: ORAL_TABLET | ORAL | Status: DC

## 2014-06-18 NOTE — Progress Notes (Signed)
   Subjective:    Patient ID: Micheal Hodge, male    DOB: 1956/06/22, 58 y.o.   MRN: [id number]  HPI He is here for a recheck. On his last visit he was placed on amlodipine. He has had no difficulty with this medication. He also has questions about being hepatitis C tested.   Review of Systems     Objective:   Physical Exam Alert and in no distress otherwise not examined. Blood pressure is adequate.       Assessment & Plan:  Essential hypertension - Plan: bisoprolol-hydrochlorothiazide (ZIAC) 10-6.25 MG per tablet, amLODipine (NORVASC) 5 MG tablet  Need for hepatitis C screening test - Plan: Hepatitis C Antibody  he will continue on his present medication regimen. Hepatitis C screening was ordered.

## 2014-06-19 LAB — HEPATITIS C ANTIBODY: HCV AB: NEGATIVE

## 2014-07-16 ENCOUNTER — Ambulatory Visit (INDEPENDENT_AMBULATORY_CARE_PROVIDER_SITE_OTHER): Payer: 59 | Admitting: Family Medicine

## 2014-07-16 ENCOUNTER — Encounter: Payer: Self-pay | Admitting: Family Medicine

## 2014-07-16 VITALS — BP 140/90 | HR 78 | Wt 205.0 lb

## 2014-07-16 DIAGNOSIS — R21 Rash and other nonspecific skin eruption: Secondary | ICD-10-CM

## 2014-07-16 NOTE — Progress Notes (Signed)
   Subjective:    Patient ID: Micheal Hodge, male    DOB: 08-Feb-1956, 58 y.o.   MRN: [id number]  HPI He is here for evaluation of a one-week history of rash. It occurred after he returned from the beach. It is scattered on his body but mainly on his torso and arms. No one else that he was with has had any rash. No fever, chills, malaise, nausea or vomiting.  Review of Systems     Objective:   Physical Exam Alert and in no distress. Scattered maculopapular lesions are noted on the neck, anterior chest and abdomen, arms. He also has 2 lesions present on the palm of his hand on the left. No lesions were seen in the underwear align and only scattered lesions present on his legs. Several of the lesions also are warm and tender with surrounding erythema.       Assessment & Plan:  Maculopapular rash - Plan: Ambulatory referral to Dermatology  this could possibly be sand flea bites however our refer to dermatology to be safe. Some of the lesions appear to be infected.

## 2014-07-16 NOTE — Progress Notes (Signed)
PATIENT HAS APPOINTMENT WITH DR.LUPTON TODAY AT 2:20 PATIENT IS AWARE

## 2014-08-07 ENCOUNTER — Encounter: Payer: Self-pay | Admitting: Family Medicine

## 2015-06-03 ENCOUNTER — Telehealth: Payer: Self-pay | Admitting: Family Medicine

## 2015-06-03 ENCOUNTER — Other Ambulatory Visit: Payer: Self-pay | Admitting: Family Medicine

## 2015-06-03 DIAGNOSIS — I10 Essential (primary) hypertension: Secondary | ICD-10-CM

## 2015-06-03 MED ORDER — BISOPROLOL-HYDROCHLOROTHIAZIDE 10-6.25 MG PO TABS: ORAL_TABLET | ORAL | Status: DC

## 2015-06-03 NOTE — Telephone Encounter (Signed)
Pt has appointment 07/22/15

## 2015-06-03 NOTE — Telephone Encounter (Signed)
He needs an appointment. I will call the medicine in

## 2015-06-03 NOTE — Telephone Encounter (Signed)
Received fax refill request from [facility] Bisoprolol/HCT 10/6.25 #90  Last filled #30 on 05/04/15

## 2015-07-01 ENCOUNTER — Other Ambulatory Visit: Payer: Self-pay | Admitting: Family Medicine

## 2015-07-22 ENCOUNTER — Ambulatory Visit (INDEPENDENT_AMBULATORY_CARE_PROVIDER_SITE_OTHER): Payer: 59 | Admitting: Family Medicine

## 2015-07-22 ENCOUNTER — Telehealth: Payer: Self-pay

## 2015-07-22 ENCOUNTER — Encounter: Payer: Self-pay | Admitting: Family Medicine

## 2015-07-22 VITALS — BP 150/96 | HR 68 | Ht 72.0 in | Wt 210.0 lb

## 2015-07-22 DIAGNOSIS — M545 Low back pain, unspecified: Secondary | ICD-10-CM

## 2015-07-22 DIAGNOSIS — Z8719 Personal history of other diseases of the digestive system: Secondary | ICD-10-CM | POA: Diagnosis not present

## 2015-07-22 DIAGNOSIS — G8929 Other chronic pain: Secondary | ICD-10-CM

## 2015-07-22 DIAGNOSIS — K5730 Diverticulosis of large intestine without perforation or abscess without bleeding: Secondary | ICD-10-CM | POA: Diagnosis not present

## 2015-07-22 DIAGNOSIS — G629 Polyneuropathy, unspecified: Secondary | ICD-10-CM | POA: Diagnosis not present

## 2015-07-22 DIAGNOSIS — Z8669 Personal history of other diseases of the nervous system and sense organs: Secondary | ICD-10-CM

## 2015-07-22 DIAGNOSIS — E785 Hyperlipidemia, unspecified: Secondary | ICD-10-CM

## 2015-07-22 DIAGNOSIS — I10 Essential (primary) hypertension: Secondary | ICD-10-CM | POA: Diagnosis not present

## 2015-07-22 LAB — COMPREHENSIVE METABOLIC PANEL
ALT: 28 U/L (ref 9–46)
AST: 23 U/L (ref 10–35)
Albumin: 4.4 g/dL (ref 3.6–5.1)
Alkaline Phosphatase: 113 U/L (ref 40–115)
BILIRUBIN TOTAL: 0.6 mg/dL (ref 0.2–1.2)
BUN: 10 mg/dL (ref 7–25)
CALCIUM: 9.5 mg/dL (ref 8.6–10.3)
CO2: 27 mmol/L (ref 20–31)
Chloride: 99 mmol/L (ref 98–110)
Creat: 0.74 mg/dL (ref 0.70–1.33)
GLUCOSE: 90 mg/dL (ref 65–99)
Potassium: 4.4 mmol/L (ref 3.5–5.3)
SODIUM: 135 mmol/L (ref 135–146)
Total Protein: 7.2 g/dL (ref 6.1–8.1)

## 2015-07-22 LAB — CBC WITH DIFFERENTIAL/PLATELET
BASOS ABS: 0 10*3/uL (ref 0.0–0.1)
BASOS PCT: 0 % (ref 0–1)
EOS ABS: 0.2 10*3/uL (ref 0.0–0.7)
EOS PCT: 2 % (ref 0–5)
HEMATOCRIT: 46 % (ref 39.0–52.0)
Hemoglobin: 16 g/dL (ref 13.0–17.0)
Lymphocytes Relative: 26 % (ref 12–46)
Lymphs Abs: 2.3 10*3/uL (ref 0.7–4.0)
MCH: 29.1 pg (ref 26.0–34.0)
MCHC: 34.8 g/dL (ref 30.0–36.0)
MCV: 83.8 fL (ref 78.0–100.0)
MONO ABS: 0.7 10*3/uL (ref 0.1–1.0)
MPV: 9.5 fL (ref 8.6–12.4)
Monocytes Relative: 8 % (ref 3–12)
Neutro Abs: 5.8 10*3/uL (ref 1.7–7.7)
Neutrophils Relative %: 64 % (ref 43–77)
PLATELETS: 317 10*3/uL (ref 150–400)
RBC: 5.49 MIL/uL (ref 4.22–5.81)
RDW: 14.2 % (ref 11.5–15.5)
WBC: 9 10*3/uL (ref 4.0–10.5)

## 2015-07-22 LAB — LIPID PANEL
Cholesterol: 242 mg/dL — ABNORMAL HIGH (ref 125–200)
HDL: 32 mg/dL — AB (ref >=40)
Total CHOL/HDL Ratio: 7.6 Ratio — ABNORMAL HIGH (ref <=5.0)
Triglycerides: 414 mg/dL — ABNORMAL HIGH (ref <150)

## 2015-07-22 MED ORDER — BISOPROLOL-HYDROCHLOROTHIAZIDE 10-6.25 MG PO TABS: ORAL_TABLET | ORAL | Status: DC

## 2015-07-22 MED ORDER — AMLODIPINE BESYLATE 5 MG PO TABS: 5.0000 mg | ORAL_TABLET | Freq: Every day | ORAL | Status: DC

## 2015-07-22 NOTE — Progress Notes (Signed)
   Subjective:    Patient ID: Micheal Hodge, male    DOB: Dec 14, 1955, 59 y.o.   MRN: [id number]  HPI He is here for an AAA evaluation. He does have underlying hypertension. He is supposed to be on 2 medications however he did stop the amlodipine due to some foot swelling while he was at the beach. He has been off of this for approximately one week.He also complains of intermittent bilateral foot numbness/80. He has been evaluated in the past and apparently has a neuropathy however has not been on any medications. 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 He did have one epidural injection but did not follow-up on this. He also has a history of hyperlipidemia and presently is on no medicine. He did have elevated triglycerides in the past. He also has a history of migraine headaches but has not had one since 2004. He also has a history of diverticulosis.She is also had difficulty with esophageal stricture however having no GI symptoms at the present time. He says he also stopped taking a PPI.   Review of Systems     Objective:   Physical Exam Alert and in no distress. Cardiac exam shows regular rhythm without murmurs or gallops prelunch clear to auscultation. Blood pressure is elevated.       Assessment & Plan:  Essential hypertension - Plan: CBC with Differential/Platelet, Comprehensive metabolic panel  History of esophageal stricture  Peripheral neuropathy  Hyperlipidemia LDL goal <130 - Plan: Lipid panel  History of migraine headaches  Diverticulosis of colon without hemorrhage  Chronic low back pain He is to start back on both of his blood pressure medications and return here in one month for recheck on that. Also recommend he come in for complete examination in roughly 6 months. Discussed treating neuropathy however at this point he is not interested.I also reviewed his records and since he is still having  difficulty with back pain, I'll have him see Dr. Ernestina Patches again. Discussed the esophageal stricture and gave instructions on when to start back on the PPI in regard to obstruction type symptoms. Over 45 minutes, greater than 50% spent in counseling and coordination of care.

## 2015-07-22 NOTE — Telephone Encounter (Signed)
ERROR

## 2015-07-23 NOTE — Progress Notes (Signed)
He has an appt to come back Oct. 20, 2016

## 2015-07-24 ENCOUNTER — Encounter: Payer: Self-pay | Admitting: Gastroenterology

## 2015-08-22 ENCOUNTER — Ambulatory Visit (INDEPENDENT_AMBULATORY_CARE_PROVIDER_SITE_OTHER): Payer: 59 | Admitting: Family Medicine

## 2015-08-22 ENCOUNTER — Encounter: Payer: Self-pay | Admitting: Family Medicine

## 2015-08-22 VITALS — BP 126/80 | HR 62 | Ht 72.0 in | Wt 210.0 lb

## 2015-08-22 DIAGNOSIS — I10 Essential (primary) hypertension: Secondary | ICD-10-CM | POA: Diagnosis not present

## 2015-08-22 NOTE — Progress Notes (Signed)
   Subjective:    Patient ID: Micheal Hodge, male    DOB: [birth year], 59 y.o.   MRN: [id number]  HPI he is here for a recheck. He did start taking hands amlodipine again that has not noted any swelling. He does state that the swelling is really not causing any discomfort.  Review of Systems     Objective:   Physical Exam  Alert and in no distress. Blood pressure is recorded.      Assessment & Plan:  Essential hypertension  since he is really having no difficulty with the edema, I recommended that he continue on the medication. I explained that amlodipine is noted for causing swelling.

## 2015-09-09 ENCOUNTER — Other Ambulatory Visit: Payer: Self-pay

## 2015-09-09 ENCOUNTER — Telehealth: Payer: Self-pay

## 2015-09-09 DIAGNOSIS — I10 Essential (primary) hypertension: Secondary | ICD-10-CM

## 2015-09-09 MED ORDER — AMLODIPINE BESYLATE 5 MG PO TABS: 5.0000 mg | ORAL_TABLET | Freq: Every day | ORAL | Status: DC

## 2015-09-09 NOTE — Telephone Encounter (Signed)
Refill request for Amlodipine 5mg  #90 sent through [facility]

## 2015-09-09 NOTE — Telephone Encounter (Signed)
Should already be handled. It  was done in September

## 2015-09-09 NOTE — Telephone Encounter (Signed)
He wanted it to mail pharmacy not [facility]

## 2015-11-13 ENCOUNTER — Encounter: Payer: Self-pay | Admitting: Gastroenterology

## 2016-01-21 ENCOUNTER — Ambulatory Visit (INDEPENDENT_AMBULATORY_CARE_PROVIDER_SITE_OTHER): Payer: 59 | Admitting: Family Medicine

## 2016-01-21 ENCOUNTER — Encounter: Payer: Self-pay | Admitting: Family Medicine

## 2016-01-21 VITALS — BP 148/94 | HR 68 | Resp 14 | Ht 72.0 in | Wt 216.0 lb

## 2016-01-21 DIAGNOSIS — G8929 Other chronic pain: Secondary | ICD-10-CM | POA: Diagnosis not present

## 2016-01-21 DIAGNOSIS — Z8719 Personal history of other diseases of the digestive system: Secondary | ICD-10-CM

## 2016-01-21 DIAGNOSIS — I10 Essential (primary) hypertension: Secondary | ICD-10-CM | POA: Diagnosis not present

## 2016-01-21 DIAGNOSIS — Z8669 Personal history of other diseases of the nervous system and sense organs: Secondary | ICD-10-CM | POA: Diagnosis not present

## 2016-01-21 DIAGNOSIS — E785 Hyperlipidemia, unspecified: Secondary | ICD-10-CM | POA: Diagnosis not present

## 2016-01-21 DIAGNOSIS — K5730 Diverticulosis of large intestine without perforation or abscess without bleeding: Secondary | ICD-10-CM

## 2016-01-21 DIAGNOSIS — K219 Gastro-esophageal reflux disease without esophagitis: Secondary | ICD-10-CM

## 2016-01-21 DIAGNOSIS — Z1159 Encounter for screening for other viral diseases: Secondary | ICD-10-CM

## 2016-01-21 DIAGNOSIS — M545 Low back pain, unspecified: Secondary | ICD-10-CM

## 2016-01-21 DIAGNOSIS — G6289 Other specified polyneuropathies: Secondary | ICD-10-CM

## 2016-01-21 DIAGNOSIS — Z0000 Encounter for general adult medical examination without abnormal findings: Secondary | ICD-10-CM

## 2016-01-21 LAB — POCT URINALYSIS DIP (MANUAL ENTRY)
BILIRUBIN UA: NEGATIVE
BILIRUBIN UA: NEGATIVE
Blood, UA: NEGATIVE
Glucose, UA: NEGATIVE
LEUKOCYTES UA: NEGATIVE
Nitrite, UA: NEGATIVE
Protein Ur, POC: NEGATIVE
Spec Grav, UA: 1.025
Urobilinogen, UA: 0.2
pH, UA: 6

## 2016-01-21 LAB — CBC WITH DIFFERENTIAL/PLATELET
BASOS ABS: 0.1 10*3/uL (ref 0.0–0.1)
BASOS PCT: 1 % (ref 0–1)
Eosinophils Absolute: 0.2 10*3/uL (ref 0.0–0.7)
Eosinophils Relative: 2 % (ref 0–5)
HCT: 49.3 % (ref 39.0–52.0)
HEMOGLOBIN: 17.1 g/dL — AB (ref 13.0–17.0)
LYMPHS ABS: 2.2 10*3/uL (ref 0.7–4.0)
Lymphocytes Relative: 24 % (ref 12–46)
MCH: 30.2 pg (ref 26.0–34.0)
MCHC: 34.7 g/dL (ref 30.0–36.0)
MCV: 86.9 fL (ref 78.0–100.0)
MONOS PCT: 7 % (ref 3–12)
MPV: 9.7 fL (ref 8.6–12.4)
Monocytes Absolute: 0.6 10*3/uL (ref 0.1–1.0)
NEUTROS ABS: 6 10*3/uL (ref 1.7–7.7)
NEUTROS PCT: 66 % (ref 43–77)
Platelets: 318 10*3/uL (ref 150–400)
RBC: 5.67 MIL/uL (ref 4.22–5.81)
RDW: 13.9 % (ref 11.5–15.5)
WBC: 9.1 10*3/uL (ref 4.0–10.5)

## 2016-01-21 MED ORDER — AMLODIPINE BESYLATE 10 MG PO TABS: 10.0000 mg | ORAL_TABLET | Freq: Every day | ORAL | Status: DC

## 2016-01-21 NOTE — Progress Notes (Signed)
Subjective:    Patient ID: Micheal Hodge, male    DOB: 1956-08-03, 60 y.o.   MRN: [id number]  HPI He is here for complete examination. He is on blood pressure medications at the present time and is having no difficulty with that. He continues to complain of numbness in his feet but this is been stable for years. At this time he is not interested in any medication. He does have a history of hyperlipidemia. Presently he is on no medication for this. He also has a history of esophageal stricture as well as reflux disease and in the past and been on a PPI. Presently he is having no reflux type symptoms and is not interested in medicine. He does have a history of chronic low back pain but again seems to have this under good control with OTC meds. He has remote history of migraine headaches but none recently. He has had a colonoscopy which did show diverticulosis. Of note is affected his father recently had an MI and apparently is in failure. His work and home life are stable. Family and social history as well as health maintenance is mentioned above and is unchanged. He has no other concerns or complaints.   Review of Systems  All other systems reviewed and are negative.      Objective:   Physical Exam BP 148/94 mmHg  Pulse 68  Resp 14  Ht 6' (1.829 m)  Wt 216 lb (97.977 kg)  BMI 29.29 kg/m2  SpO2 95%  General Appearance:    Alert, cooperative, no distress, appears stated age  Head:    Normocephalic, without obvious abnormality, atraumatic  Eyes:    PERRL, conjunctiva/corneas clear, EOM's intact, fundi    benign  Ears:    Normal TM's and external ear canals  Nose:   Nares normal, mucosa normal, no drainage or sinus   tenderness  Throat:   Lips, mucosa, and tongue normal; teeth and gums normal  Neck:   Supple, no lymphadenopathy;  thyroid:  no   enlargement/tenderness/nodules; no carotid   bruit or JVD  Back:    Spine nontender, no curvature, ROM normal, no CVA     tenderness  Lungs:      Clear to auscultation bilaterally without wheezes, rales or     ronchi; respirations unlabored  Chest Wall:    No tenderness or deformity   Heart:    Regular rate and rhythm, S1 and S2 normal, no murmur, rub   or gallop  Breast Exam:    No chest wall tenderness, masses or gynecomastia  Abdomen:     Soft, non-tender, nondistended, normoactive bowel sounds,    no masses, no hepatosplenomegaly  Genitalia:    Normal male external genitalia without lesions.  Testicles without masses.  No inguinal hernias.     Extremities:   No clubbing, cyanosis or edema  Pulses:   2+ and symmetric all extremities  Skin:   Skin color, texture, turgor normal, no rashes or lesions  Lymph nodes:   Cervical, supraclavicular, and axillary nodes normal  Neurologic:   CNII-XII intact, normal strength, sensation and gait; reflexes 2+ and symmetric throughout          Psych:   Normal mood, affect, hygiene and grooming.          Assessment & Plan:  Annual physical exam - Plan: POCT urinalysis dipstick, CBC with Differential/Platelet, Comprehensive metabolic panel, Lipid panel  Essential hypertension - Plan: amLODipine (NORVASC) 10 MG tablet, CBC with Differential/Platelet,  Comprehensive metabolic panel, Lipid panel  Other polyneuropathy (HCC)  Hyperlipidemia LDL goal <130 - Plan: Lipid panel  History of migraine headaches  History of esophageal stricture  Gastroesophageal reflux disease without esophagitis  Diverticulosis of large intestine without hemorrhage  Chronic low back pain  Need for hepatitis C screening test - Plan: Hepatitis C antibody he seems to be handling his present situation fairly well although he did state that he is considering having his parents moved in with him. I encouraged him to be careful concerning this. Continue on present medication regimen.

## 2016-01-22 LAB — COMPREHENSIVE METABOLIC PANEL
ALT: 23 U/L (ref 9–46)
AST: 19 U/L (ref 10–35)
Albumin: 4.3 g/dL (ref 3.6–5.1)
Alkaline Phosphatase: 106 U/L (ref 40–115)
BUN: 10 mg/dL (ref 7–25)
CHLORIDE: 95 mmol/L — AB (ref 98–110)
CO2: 24 mmol/L (ref 20–31)
CREATININE: 0.7 mg/dL (ref 0.70–1.33)
Calcium: 9.5 mg/dL (ref 8.6–10.3)
Glucose, Bld: 102 mg/dL — ABNORMAL HIGH (ref 65–99)
Potassium: 4.4 mmol/L (ref 3.5–5.3)
SODIUM: 136 mmol/L (ref 135–146)
TOTAL PROTEIN: 7 g/dL (ref 6.1–8.1)
Total Bilirubin: 0.8 mg/dL (ref 0.2–1.2)

## 2016-01-22 LAB — LIPID PANEL
CHOL/HDL RATIO: 6.8 ratio — AB (ref <=5.0)
Cholesterol: 223 mg/dL — ABNORMAL HIGH (ref 125–200)
HDL: 33 mg/dL — ABNORMAL LOW (ref >=40)
Triglycerides: 486 mg/dL — ABNORMAL HIGH (ref <150)

## 2016-01-22 LAB — HEPATITIS C ANTIBODY: HCV AB: NEGATIVE

## 2016-02-21 ENCOUNTER — Encounter: Payer: Self-pay | Admitting: Family Medicine

## 2016-02-21 ENCOUNTER — Ambulatory Visit (INDEPENDENT_AMBULATORY_CARE_PROVIDER_SITE_OTHER): Payer: 59 | Admitting: Family Medicine

## 2016-02-21 VITALS — BP 138/78 | HR 70 | Ht 72.0 in | Wt 210.8 lb

## 2016-02-21 DIAGNOSIS — I10 Essential (primary) hypertension: Secondary | ICD-10-CM

## 2016-02-21 NOTE — Progress Notes (Signed)
   Subjective:    Patient ID: Micheal Hodge, male    DOB: May 02, 1956, 60 y.o.   MRN: [id number]  HPI He is now on 10 mg of Norvasc which did cause some edema. He stopped the medicine for several days and the swelling went away. He has now started back on the medication and has not noted the swelling to reoccur.   Review of Systems     Objective:   Physical Exam Alert and in no distress. Blood pressure is recorded.       Assessment & Plan:  Essential hypertension we discussed this in detail and as long as the swelling stays away, I will continue him on Norvasc. If the edema recurs, I will probably switch him to [employer].

## 2016-03-05 ENCOUNTER — Other Ambulatory Visit: Payer: Self-pay

## 2016-03-05 ENCOUNTER — Telehealth: Payer: Self-pay | Admitting: Family Medicine

## 2016-03-05 MED ORDER — AZILSARTAN-CHLORTHALIDONE 40-12.5 MG PO TABS: 1.0000 | ORAL_TABLET | Freq: Every day | ORAL | Status: DC

## 2016-03-05 NOTE — Telephone Encounter (Signed)
Let's put him on Edarbyclor 40/12.5. Make sure he knows he is to get this at [facility]. Give him only appropriate information and recheck blood pressure here in 1 month

## 2016-03-05 NOTE — Telephone Encounter (Signed)
Pt informed word for word  

## 2016-03-05 NOTE — Telephone Encounter (Signed)
Pt said that his BP med is continuing to make his feet and legs swell.Pt said he was to let Dr Redmond [employer] know if this continues so that he can possibly change BP med

## 2016-04-03 ENCOUNTER — Other Ambulatory Visit: Payer: Self-pay

## 2016-04-03 ENCOUNTER — Telehealth: Payer: Self-pay | Admitting: Family Medicine

## 2016-04-03 MED ORDER — AZILSARTAN MEDOXOMIL 40 MG PO TABS: 1.0000 | ORAL_TABLET | Freq: Every day | ORAL | Status: DC

## 2016-04-03 NOTE — Telephone Encounter (Signed)
He is having postural hypotension symptoms on his present medication regimen. I will switch him to same med without HCTZ

## 2016-04-03 NOTE — Telephone Encounter (Signed)
Patient informed and verbalized understanding

## 2016-04-03 NOTE — Telephone Encounter (Signed)
Let him know that he should stop taking his present medication. I called in the same medication without the HCTZ and that C with that dose. His blood pressure is going a little too low

## 2016-04-03 NOTE — Telephone Encounter (Signed)
Pt states started new BP med 3 weeks ago & every time he bends over and stands up gets dizzy and feels like going to fall over, then feeling passes quickly.  Thinks BP is dropping when he stands up.  Yesterday BP was 106/62 & pulse 104.  Pulse has been running around 100s, used to be 60s & 70s.  Wants to know what you think, please advise

## 2016-10-19 ENCOUNTER — Other Ambulatory Visit: Payer: Self-pay | Admitting: Family Medicine

## 2016-11-05 ENCOUNTER — Encounter: Payer: Self-pay | Admitting: Family Medicine

## 2016-11-05 ENCOUNTER — Ambulatory Visit (INDEPENDENT_AMBULATORY_CARE_PROVIDER_SITE_OTHER): Payer: 59 | Admitting: Family Medicine

## 2016-11-05 VITALS — BP 130/90 | HR 80 | Wt 211.8 lb

## 2016-11-05 DIAGNOSIS — I10 Essential (primary) hypertension: Secondary | ICD-10-CM | POA: Diagnosis not present

## 2016-11-05 MED ORDER — AZILSARTAN MEDOXOMIL 40 MG PO TABS: 1.0000 | ORAL_TABLET | Freq: Every day | ORAL | 11 refills | Status: DC

## 2016-11-05 NOTE — Progress Notes (Signed)
   Subjective:    Patient ID: Micheal Hodge, male    DOB: 11-May-1956, 61 y.o.   MRN: [id number]  HPI He is here for a recheck. He is doing quite nicely on [place] and is having no edema issues with that. At the end of the interview he then commented about seeing occasional episodes of bright red blood and intermittent constipation and diarrhea.   Review of Systems     Objective:   Physical Exam Blood pressure was 128/84.       Assessment & Plan:  Essential hypertension - Plan: Azilsartan Medoxomil (EDARBI) 40 MG TABS  I will keep him on his present medication regimen. Also discussed his bright red blood. He does have symptoms sound like hemorrhoids as well as a possible rectal fissure. Recommended fluids, bulk in his diet and exercise to keep him regular.

## 2017-01-15 ENCOUNTER — Ambulatory Visit (INDEPENDENT_AMBULATORY_CARE_PROVIDER_SITE_OTHER): Payer: 59 | Admitting: Family Medicine

## 2017-01-15 ENCOUNTER — Encounter: Payer: Self-pay | Admitting: Family Medicine

## 2017-01-15 VITALS — BP 122/82 | HR 86 | Temp 98.2°F | Wt 214.0 lb

## 2017-01-15 DIAGNOSIS — J029 Acute pharyngitis, unspecified: Secondary | ICD-10-CM | POA: Diagnosis not present

## 2017-01-15 LAB — POCT RAPID STREP A (OFFICE): Rapid Strep A Screen: NEGATIVE

## 2017-01-15 NOTE — Progress Notes (Signed)
   Subjective:    Patient ID: Micheal Hodge, male    DOB: 1956/08/29, 61 y.o.   MRN: [id number]  HPI He complains of a one-day history of sore throat with fatigue but no fever, chills, cough, earache, malaise. His parents are about to move in with him.   Review of Systems     Objective:   Physical Exam Alert and in no distress. Tympanic membranes and canals are normal. Pharyngeal area is normal. Neck is supple without adenopathy or thyromegaly. Cardiac exam shows a regular sinus rhythm without murmurs or gallops. Lungs are clear to auscultation. Screen negative.       Assessment & Plan:  Sore throat - Plan: POCT rapid strep A Recommended supportive care. Call if further trouble.

## 2017-07-21 DIAGNOSIS — H5213 Myopia, bilateral: Secondary | ICD-10-CM | POA: Diagnosis not present

## 2017-08-02 ENCOUNTER — Ambulatory Visit (INDEPENDENT_AMBULATORY_CARE_PROVIDER_SITE_OTHER): Payer: 59 | Admitting: Orthopaedic Surgery

## 2017-08-02 ENCOUNTER — Encounter (INDEPENDENT_AMBULATORY_CARE_PROVIDER_SITE_OTHER): Payer: Self-pay | Admitting: Orthopaedic Surgery

## 2017-08-02 ENCOUNTER — Ambulatory Visit (INDEPENDENT_AMBULATORY_CARE_PROVIDER_SITE_OTHER): Payer: 59

## 2017-08-02 DIAGNOSIS — M67442 Ganglion, left hand: Secondary | ICD-10-CM | POA: Diagnosis not present

## 2017-08-02 NOTE — Progress Notes (Signed)
Office Visit Note   Patient: Micheal Hodge           Date of Birth: [birth year]           MRN: [id number] Visit Date: 08/02/2017              Requested by: Denita Lung, [address] [place], [place] [address] PCP: Denita Lung, MD   Assessment & Plan: Visit Diagnoses:  1. Ganglion cyst of finger of left hand     Plan: Clinical impression is consistent with an annular cyst of the A1 pulley of the left middle finger. We discussed leaving it alone versus surgical excision. He will think about this and let us know.  Follow-Up Instructions: Return if symptoms worsen or fail to improve.   Orders:  Orders Placed This Encounter  Procedures  . XR Hand Complete Left   No orders of the defined types were placed in this encounter.     Procedures: No procedures performed   Clinical Data: No additional findings.   Subjective: Chief Complaint  Patient presents with  . Left Hand - Cyst, Pain    Patient is a 61 year old gentleman who comes in with a left hand cyst in line with the middle finger at the distal and palmar flexion crease. He has noticed it for about a month now and bothers him when he uses his hand in certain ways. He denies any constitutional symptoms or any personal history of cancer. Denies any numbness and tingling.    Review of Systems  Constitutional: Negative.   All other systems reviewed and are negative.    Objective: Vital Signs: There were no vitals taken for this visit.  Physical Exam  Constitutional: He is oriented to person, place, and time. He appears well-developed and well-nourished.  HENT:  Head: Normocephalic and atraumatic.  Eyes: Pupils are equal, round, and reactive to light.  Neck: Neck supple.  Pulmonary/Chest: Effort normal.  Abdominal: Soft.  Musculoskeletal: Normal range of motion.  Neurological: He is alert and oriented to person, place, and time.  Skin: Skin is warm.  Psychiatric: He has a normal mood and  affect. His behavior is normal. Judgment and thought content normal.  Nursing note and vitals reviewed.   Ortho Exam Left hand exam shows a palpable firm mass in line with the middle finger between the proximal and distal palmar flexion crease. There is no overlying skin changes or warmth or signs of infection. This is mildly tender. There is no finger triggering. Specialty Comments:  No specialty comments available.  Imaging: Xr Hand Complete Left  Result Date: 08/02/2017 Negative for bony abnormalities    PMFS History: Patient Active Problem List   Diagnosis Date Noted  . Ganglion cyst of finger of left hand 08/02/2017  . Chronic low back pain 07/22/2015  . History of migraine headaches 07/22/2015  . Hyperlipidemia LDL goal <130 07/22/2015  . Hypertension 01/11/2014  . History of esophageal stricture 12/05/2012  . Peripheral neuropathy 12/05/2012  . Diverticulosis of colon-tics scattered throughout with preponderance in sigmoid 02/03/2012  . Internal hemorrhoids 06/15/2011  . GERD 07/31/2008   Past Medical History:  Diagnosis Date  . Allergy   . Diverticulitis   . Diverticulosis   . ED (erectile dysfunction)   . Esophageal stricture   . GERD (gastroesophageal reflux disease)   . Hyperlipidemia   . Hypertension   . Migraine headache   . Rosacea     Family History  Problem Relation Age  of Onset  . Diabetes Mother   . Hyperlipidemia Mother   . Heart disease Mother        stents  . Cancer Mother        Melanoma  . Hypertension Father     Past Surgical History:  Procedure Laterality Date  . COLONOSCOPY  2009  . NASAL SEPTUM SURGERY    . UPPER GASTROINTESTINAL ENDOSCOPY  2009   Social History   Occupational History  . 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  [place] History Main Topics  . Smoking status: Former Smoker    Years: 15.00    Quit date: 11/02/1986  . Smokeless tobacco: Never Used  . Alcohol use 12.0 oz/week      20 Cans of beer per week     Comment: 1 drink per day.  . Drug use: No  . Sexual activity: Not Currently

## 2017-12-06 ENCOUNTER — Other Ambulatory Visit: Payer: Self-pay | Admitting: Family Medicine

## 2017-12-06 DIAGNOSIS — I10 Essential (primary) hypertension: Secondary | ICD-10-CM

## 2017-12-27 ENCOUNTER — Encounter: Payer: Self-pay | Admitting: Family Medicine

## 2017-12-27 ENCOUNTER — Ambulatory Visit: Payer: 59 | Admitting: Family Medicine

## 2017-12-27 VITALS — BP 142/88 | HR 101 | Wt 211.6 lb

## 2017-12-27 DIAGNOSIS — Z6379 Other stressful life events affecting family and household: Secondary | ICD-10-CM

## 2017-12-27 DIAGNOSIS — Z79899 Other long term (current) drug therapy: Secondary | ICD-10-CM

## 2017-12-27 DIAGNOSIS — K648 Other hemorrhoids: Secondary | ICD-10-CM | POA: Diagnosis not present

## 2017-12-27 DIAGNOSIS — E785 Hyperlipidemia, unspecified: Secondary | ICD-10-CM | POA: Diagnosis not present

## 2017-12-27 DIAGNOSIS — Z1211 Encounter for screening for malignant neoplasm of colon: Secondary | ICD-10-CM

## 2017-12-27 DIAGNOSIS — K5730 Diverticulosis of large intestine without perforation or abscess without bleeding: Secondary | ICD-10-CM | POA: Diagnosis not present

## 2017-12-27 DIAGNOSIS — I10 Essential (primary) hypertension: Secondary | ICD-10-CM | POA: Diagnosis not present

## 2017-12-27 MED ORDER — HYDROCHLOROTHIAZIDE 12.5 MG PO CAPS: 12.5000 mg | ORAL_CAPSULE | Freq: Every day | ORAL | 3 refills | Status: DC

## 2017-12-27 MED ORDER — AZILSARTAN MEDOXOMIL 40 MG PO TABS: 1.0000 | ORAL_TABLET | Freq: Every day | ORAL | 0 refills | Status: DC

## 2017-12-27 NOTE — Progress Notes (Signed)
   Subjective:    Patient ID: Micheal Hodge, male    DOB: 08/26/1956, 62 y.o.   MRN: [id number]  HPI He is here for medication check.  He continues on [place] and is doing well on this.  He is now taking care of his mother who is in a nursing home.  Apparently his father is living there as well as another brother.  He seems to be handling this fairly well.  Review of record indicates it is time for another colonoscopy for him.  He also has a history of hyperlipidemia.  His work is going well.   Review of Systems     Objective:   Physical Exam Alert and in no distress. Tympanic membranes and canals are normal. Pharyngeal area is normal. Neck is supple without adenopathy or thyromegaly. Cardiac exam shows a regular sinus rhythm without murmurs or gallops. Lungs are clear to auscultation.        Assessment & Plan:  Essential hypertension - Plan: Azilsartan Medoxomil (EDARBI) 40 MG TABS, hydrochlorothiazide (MICROZIDE) 12.5 MG capsule, CBC with Differential/Platelet, Comprehensive metabolic panel  Hyperlipidemia LDL goal <130 - Plan: Lipid panel  Internal hemorrhoids  Diverticulosis of colon-tics scattered throughout with preponderance in sigmoid  Stress due to illness of family member  Encounter for long-term (current) use of medications - Plan: CBC with Differential/Platelet, Comprehensive metabolic panel, Lipid panel  Screening for colon cancer - Plan: Cologuard I will add HCTZ to his regimen and recommend he return here in 1 month for follow-up blood pressure check.

## 2017-12-28 LAB — COMPREHENSIVE METABOLIC PANEL
A/G RATIO: 1.7 (ref 1.2–2.2)
ALK PHOS: 107 IU/L (ref 39–117)
ALT: 33 IU/L (ref 0–44)
AST: 22 IU/L (ref 0–40)
Albumin: 4.6 g/dL (ref 3.5–5.5)
BILIRUBIN TOTAL: 0.4 mg/dL (ref 0.0–1.2)
BUN / CREAT RATIO: 9 (ref 9–20)
BUN: 8 mg/dL (ref 6–24)
CALCIUM: 9.9 mg/dL (ref 8.7–10.2)
CHLORIDE: 99 mmol/L (ref 96–106)
CO2: 24 mmol/L (ref 20–29)
Creatinine, Ser: 0.86 mg/dL (ref 0.76–1.27)
GFR calc Af Amer: 110 mL/min/{1.73_m2} (ref >59)
GFR calc non Af Amer: 95 mL/min/{1.73_m2} (ref >59)
GLOBULIN, TOTAL: 2.7 g/dL (ref 1.5–4.5)
Glucose: 109 mg/dL — ABNORMAL HIGH (ref 65–99)
Potassium: 4.6 mmol/L (ref 3.5–5.2)
Sodium: 142 mmol/L (ref 134–144)
Total Protein: 7.3 g/dL (ref 6.0–8.5)

## 2017-12-28 LAB — LIPID PANEL
CHOLESTEROL TOTAL: 260 mg/dL — AB (ref 100–199)
Chol/HDL Ratio: 7.9 ratio — ABNORMAL HIGH (ref 0.0–5.0)
HDL: 33 mg/dL — ABNORMAL LOW (ref >39)
Triglycerides: 564 mg/dL (ref 0–149)

## 2017-12-28 LAB — CBC WITH DIFFERENTIAL/PLATELET
BASOS: 1 %
Basophils Absolute: 0 10*3/uL (ref 0.0–0.2)
EOS (ABSOLUTE): 0.2 10*3/uL (ref 0.0–0.4)
EOS: 2 %
HEMATOCRIT: 51.9 % — AB (ref 37.5–51.0)
HEMOGLOBIN: 17.9 g/dL — AB (ref 13.0–17.7)
IMMATURE GRANULOCYTES: 1 %
Immature Grans (Abs): 0.1 10*3/uL (ref 0.0–0.1)
LYMPHS: 27 %
Lymphocytes Absolute: 2.4 10*3/uL (ref 0.7–3.1)
MCH: 30.5 pg (ref 26.6–33.0)
MCHC: 34.5 g/dL (ref 31.5–35.7)
MCV: 89 fL (ref 79–97)
MONOCYTES: 9 %
Monocytes Absolute: 0.8 10*3/uL (ref 0.1–0.9)
NEUTROS PCT: 60 %
Neutrophils Absolute: 5.5 10*3/uL (ref 1.4–7.0)
Platelets: 323 10*3/uL (ref 150–379)
RBC: 5.86 x10E6/uL — AB (ref 4.14–5.80)
RDW: 13.3 % (ref 12.3–15.4)
WBC: 8.9 10*3/uL (ref 3.4–10.8)

## 2018-01-08 ENCOUNTER — Encounter: Payer: Self-pay | Admitting: Gastroenterology

## 2018-01-09 LAB — SPECIMEN STATUS REPORT

## 2018-01-09 LAB — HGB A1C W/O EAG: HEMOGLOBIN A1C: 5.7 % — AB (ref 4.8–5.6)

## 2018-01-15 DIAGNOSIS — Z1212 Encounter for screening for malignant neoplasm of rectum: Secondary | ICD-10-CM | POA: Diagnosis not present

## 2018-01-15 DIAGNOSIS — Z1211 Encounter for screening for malignant neoplasm of colon: Secondary | ICD-10-CM | POA: Diagnosis not present

## 2018-01-17 LAB — COLOGUARD: COLOGUARD: NEGATIVE

## 2018-01-24 ENCOUNTER — Encounter: Payer: Self-pay | Admitting: Family Medicine

## 2018-01-24 ENCOUNTER — Ambulatory Visit: Payer: 59 | Admitting: Family Medicine

## 2018-01-24 DIAGNOSIS — I10 Essential (primary) hypertension: Secondary | ICD-10-CM | POA: Diagnosis not present

## 2018-01-24 MED ORDER — AZILSARTAN MEDOXOMIL 40 MG PO TABS: 1.0000 | ORAL_TABLET | Freq: Every day | ORAL | 12 refills | Status: DC

## 2018-01-24 NOTE — Progress Notes (Signed)
   Subjective:    Patient ID: Micheal Hodge, male    DOB: 22-Apr-1956, 62 y.o.   MRN: [id number]  HPI He is here for blood pressure recheck.  He has no concerns or complaints.  Review of Systems     Objective:   Physical Exam  Alert and in no distress.  Blood pressure is recorded.      Assessment & Plan:  Essential hypertension - Plan: Azilsartan Medoxomil (EDARBI) 40 MG TABS  He will continue on his present medication regimen.

## 2018-05-03 ENCOUNTER — Ambulatory Visit (INDEPENDENT_AMBULATORY_CARE_PROVIDER_SITE_OTHER): Payer: 59 | Admitting: Family Medicine

## 2018-05-03 ENCOUNTER — Encounter: Payer: Self-pay | Admitting: Family Medicine

## 2018-05-03 VITALS — BP 120/76 | HR 86 | Temp 98.0°F | Wt 201.8 lb

## 2018-05-03 DIAGNOSIS — G6289 Other specified polyneuropathies: Secondary | ICD-10-CM | POA: Diagnosis not present

## 2018-05-03 NOTE — Progress Notes (Signed)
   Subjective:    Patient ID: Micheal Hodge, male    DOB: 12-22-1955, 62 y.o.   M  Symptoms have been getting worse and he now is interested in pursuing this further.      RN: [id number]  HPI He is here for consult concerning continued difficulty with his legs.  He states that in 2008 he was evaluated for bilateral foot dysesthesias.  He describes a burning tingling sensation and was seen by neurology.  Apparently EMG studies were negative.  Neurontin was recommended however he did not take it.  Since then he has noted continued worsening of his symptoms now more of a numbness sensation that has progressed to above his knees bilaterally.  One time he does mention cutting his foot while in the shower and noting no sensation from it.  He is also had other instances where he had splinters in his feet but did not know it.  He sometimes does note a feeling of imbalance but has not fallen.  He has had no bowel or bladder issues. Does also have a long history of chronic low back pain but seems to be unrelated to his present problem.  Review of Systems     Objective:   Physical Exam Alert and in no distress.  Pulses normal in his extremities.  He complains of decreased sensation in a glove distribution bilaterally to just above the knee.  Knee reflex on the right is 1-2+ and left is gone.  No ankle reflexes.  No clonus.  Full motion of the ankle.       Assessment & Plan:  Other polyneuropathy - Plan: Ambulatory referral to Neurology

## 2018-05-16 ENCOUNTER — Encounter: Payer: Self-pay | Admitting: Nurse Practitioner

## 2018-06-02 ENCOUNTER — Ambulatory Visit: Payer: 59 | Admitting: Nurse Practitioner

## 2018-06-02 ENCOUNTER — Encounter: Payer: Self-pay | Admitting: Nurse Practitioner

## 2018-06-02 VITALS — BP 118/78 | HR 78 | Ht 73.0 in | Wt 198.4 lb

## 2018-06-02 DIAGNOSIS — R194 Change in bowel habit: Secondary | ICD-10-CM

## 2018-06-02 DIAGNOSIS — R1032 Left lower quadrant pain: Secondary | ICD-10-CM

## 2018-06-02 MED ORDER — DICYCLOMINE HCL 20 MG PO TABS: 20.0000 mg | ORAL_TABLET | ORAL | 0 refills | Status: DC

## 2018-06-02 NOTE — Patient Instructions (Addendum)
If you are age 60 or older, your body mass index should be between 23-30. Your Body mass index is 26.18 kg/m. If this is out of the aforementioned range listed, please consider follow up with your Primary Care Provider.  If you are age 67 or younger, your body mass index should be between 19-25. Your Body mass index is 26.18 kg/m. If this is out of the aformentioned range listed, please consider follow up with your Primary Care Provider.   We have sent the following medications to your pharmacy for you to pick up at your convenience: Dicyclomine 20 mg  INCREASE water to 6 - 8 ounce glasses daily.  Start Benefiber daily.  Call with an update in 3-4 weeks.  Call sooner if getting worse.  Thank you for choosing me and [facility].   Tye Savoy, NP

## 2018-06-02 NOTE — Progress Notes (Signed)
Primary GI:  Previously Dr. Deatra Ina.          Chief Complaint: Abdominal pain and irregular bowel movements   ASSESSMENT AND PLAN;   #48.  62 year old male with chronically altered bowel habits consisting of constipation/loose stool with associated left side abdominal pain.  Patient told years ago he had a " spastic colon" . By description it sounds like the underlying problem is really constipation with pain.  Bowel pattern has not changed in years but he has had more left-sided pain in the last 6 months.  Negative Cologuard in March 2019 -I would like to try him on Benefiber first. He will take every day for a month then call with update.  He will call sooner if symptoms worsen -If bowels not regulated with fiber then will discontinue and probably try daily MiraLAX which patient has never tried. -Increase water intake to at least 6 glasses of water a day -Trial of dicyclomine 20 mg once daily for left-sided abdominal pain.  For now I do not want him to use it more than that since he is already having problems with constipation  #2.  Colon cancer screening.  He had a negative Cologuard in March 2019.  No blood in stool, anemia, or unexplained weight loss.  His bowel habits are irregular but unchanged.   -Last colonoscopy 10 years ago but in light of negative Cologuard this year we will not proceed with colonoscopy at this time  HPI:    Patient is a 63 year old male with a history of diverticulitis with microperforation in 2012. He gives a lifelong history of alternating constipation / diarrhea associated with left-sided abdominal. He is here because of worsening abdominal pain. Pain left mid / LLQ, gets better but doesn't resolve with defecation. Constipation definitely aggravates the pain. Eating makes the pain worse if he is in constipated state. Some days he has multiple small volume watery BMs a day then over following few days stool consistency varies until eventually turns into  constipation again. Tried metamucil a long time ago but didn't take it on a regular basis. No blood in stool.  No unexplained weight loss.  Labs late February of this year show a hgb of 17.9, normal liver chemistries, normal renal function.  No other GI problems.    Past Medical History:  Diagnosis Date  . Allergy   . Arthritis   . Bowel obstruction ([place])   . Colon polyps   . Diverticulitis   . Diverticulosis   . ED (erectile dysfunction)   . Elevated cholesterol   . Esophageal stricture   . GERD (gastroesophageal reflux disease)   . Headache   . High blood pressure   . Hyperlipidemia   . Hypertension   . Inflammatory bowel disease   . Migraine headache   . Pneumonia   . Rosacea      Past Surgical History:  Procedure Laterality Date  . COLONOSCOPY  2009  . NASAL SEPTUM SURGERY    . UPPER GASTROINTESTINAL ENDOSCOPY  2009   Family History  Problem Relation Age of Onset  . Diabetes Mother   . Hyperlipidemia Mother   . Heart disease Mother        stents  . Cancer Mother        Melanoma  . Hypertension Father    Social History   Tobacco Use  . Smoking status: Former Smoker    Years: 15.00    Last attempt to quit: 11/02/1986    Years  since quitting: 31.6  . Smokeless tobacco: Never Used  Substance Use Topics  . Alcohol use: Yes    Alcohol/week: 12.0 oz    Types: 20 Cans of beer per week    Comment: 1 drink per day.  . Drug use: No   Current Outpatient Medications  Medication Sig Dispense Refill  . Azilsartan Medoxomil (EDARBI) 40 MG TABS Take 1 tablet by mouth daily. 30 tablet 12  . hydrochlorothiazide (MICROZIDE) 12.5 MG capsule Take 1 capsule (12.5 mg total) by mouth daily. 90 capsule 3   No current facility-administered medications for this visit.    No Known Allergies   Review of Systems: Positive for allergy, sinus trouble, back pain, vision changes, fatigue, muscle pain and cramps, skin rash, sleeping problems, sore throat and swelling of feet and  legs.  All other systems reviewed and negative except where noted in HPI.   Creatinine clearance cannot be calculated (Patient's most recent lab result is older than the maximum 21 days allowed.)   Physical Exam:    Wt Readings from Last 3 Encounters:  06/02/18 198 lb 6.4 oz (90 kg)  05/03/18 201 lb 12.8 oz (91.5 kg)  01/24/18 209 lb 12.8 oz (95.2 kg)    BP 118/78   Pulse 78   Ht 6\' 1"  (1.854 m)   Wt 198 lb 6.4 oz (90 kg)   BMI 26.18 kg/m  Constitutional:  Pleasant male in no acute distress. Psychiatric: Normal mood and affect. Behavior is normal. EENT: Pupils normal.  Conjunctivae are normal. No scleral icterus. Neck supple.  Cardiovascular: Normal rate, regular rhythm. No edema Pulmonary/chest: Effort normal and breath sounds normal. No wheezing, rales or rhonchi. Abdominal: Soft, nondistended, nontender. Bowel sounds active throughout. There are no masses palpable. No hepatomegaly. Neurological: Alert and oriented to person place and time. Skin: Skin is warm and dry. No rashes noted.  Tye Savoy, NP  06/02/2018, 9:35 AM   Denita Lung, MD

## 2018-06-08 NOTE — Progress Notes (Signed)
Repeat Cologaurd or colonoscopy in 3 years for colorectal cancer screening. Reviewed and agree with documentation and assessment and plan. Damaris Hippo , MD

## 2018-07-11 ENCOUNTER — Ambulatory Visit: Payer: 59 | Admitting: Neurology

## 2018-07-11 ENCOUNTER — Encounter: Payer: Self-pay | Admitting: Neurology

## 2018-07-11 ENCOUNTER — Encounter

## 2018-07-11 VITALS — BP 133/90 | HR 79 | Ht 73.0 in | Wt 197.0 lb

## 2018-07-11 DIAGNOSIS — E889 Metabolic disorder, unspecified: Secondary | ICD-10-CM

## 2018-07-11 DIAGNOSIS — D519 Vitamin B12 deficiency anemia, unspecified: Secondary | ICD-10-CM | POA: Diagnosis not present

## 2018-07-11 DIAGNOSIS — M21379 Foot drop, unspecified foot: Secondary | ICD-10-CM | POA: Diagnosis not present

## 2018-07-11 DIAGNOSIS — E569 Vitamin deficiency, unspecified: Secondary | ICD-10-CM | POA: Diagnosis not present

## 2018-07-11 DIAGNOSIS — G63 Polyneuropathy in diseases classified elsewhere: Secondary | ICD-10-CM | POA: Diagnosis not present

## 2018-07-11 NOTE — Progress Notes (Signed)
SLEEP MEDICINE CLINIC   Provider:  Larey Seat, [place] D  Primary Care Physician:  Denita Lung, MD   Referring Provider: Denita Lung, MD    Chief Complaint  Patient presents with  . New Patient (Initial Visit)    pt in 2008 he had tingling in toes bilaterall that started and continued and progess upwars til waist. he has this feeling in his fingertips. in 09 went to PCP, neurologist at that time, orthropedic and no one was able to tell what cause was. the numbness and tingling is still present, unable to feel     HPI:  Micheal Hodge is a 62 y.o. male patient , seen here as a new patient in a referral from Dr. Redmond School for a neuropathy work up, but scheduled in the sleep clinic.   Chief complaint according to patient : " I was seen in 2009 by a Neurology office on [address] , had EMg and NCV and they wanted to put me on Neurontin, never knowing the cause"   I have the pleasure of meeting Micheal Hodge today as a new patient to our neurology office, he is a 62 year old Caucasian gentleman with a slowly progressive history of peripheral neuropathy.  About 6 weeks ago he stepped into some sharp object at work, presented to his occupational health nurse and had actually not felt the foreign body embedded in his foot.  About 2 years ago he had stepped into a form, his foot started bleeding but he was not aware that the blood on the floor was hits.  Again he had not felt the sharp objects and putting in hospital.  Since his first neurological work-up initiated by Dr. Redmond School took place almost 10 years ago, he had undergone nerve conduction velocity studies, EMGs and had seen orthopedist Dr. Sharol Given in the work-up. At the time no reason for his dysesthesias numbness sensation pins and needle or burning sensations was found.   He sometimes now has a sense of not being balanced of not controlling the position of his body and feet but he has not fallen. He has ED, and has numbness feeling in the  finger tips now- a progression.  He has stumbled on occasion.  He does not have associated incontinence -he may have some incomplete evacuation, with his bowel and bladder by description he seems not to have the same flow velocity likely a consequence of benign prostate hyperplasia..   Neuropathy medical history : father has neuropathy, starting with him in his feet. He is not diabetic. Patient was just diagnosed as pre diabetic. He has HTN, high cholesterol, migraines- these stooped in 2004 -  nasal septum surgery 1988.       Social history:  former meter reader, drank 3 liters of mountain dew at that the time, took caffeine pills in college. - single, non smoker - quit in 1988, ETOH- 3-4 beer drinks a day. Caffeine : reduced about 3 years ago- 2 cups of coffee on weekends.  No black tea .   Review of Systems: Out of a complete 14 system review, the patient complains of only the following symptoms, and all other reviewed systems are negative. Numbness, burning, tingling, pin and needles- worse at night.   Epworth score and Fatigue severity score were not obtained.   , depression score 5/ 15 , elevated   Social History   Socioeconomic History  . Marital status: Divorced    Spouse name: Not on file  . Number  of children: 1  . Years of education: Not on file  . Highest education level: Not on file  Occupational History  . 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 Financial resource strain: Not on file  . Food insecurity:    Worry: Not on file    Inability: Not on file  . Transportation needs:    Medical: Not on file    Non-medical: Not on file  Tobacco Use  . Smoking status: Former Smoker    Years: 15.00    Last attempt to quit: 11/02/1986    Years since quitting: 31.7  . Smokeless tobacco: Never Used  Substance and Sexual Activity  . Alcohol use: Yes    Alcohol/week: 20.0 standard drinks     Types: 20 Cans of beer per week  . Drug use: No  . Sexual activity: Not Currently  Lifestyle  . Physical activity:    Days per week: Not on file    Minutes per session: Not on file  . Stress: Not on file  Relationships  . Social connections:    Talks on phone: Not on file    Gets together: Not on file    Attends religious service: Not on file    Active member of club or organization: Not on file    Attends meetings of clubs or organizations: Not on file    Relationship status: Not on file  . Intimate partner violence:    Fear of current or ex partner: Not on file    Emotionally abused: Not on file    Physically abused: Not on file    Forced sexual activity: Not on file  Other Topics Concern  . Not on file  Social History Narrative  . Not on file    Family History  Problem Relation Age of Onset  . Diabetes Mother   . Hyperlipidemia Mother   . Heart disease Mother        stents  . Cancer Mother        Melanoma  . Hypertension Father   . Heart disease Father     Past Medical History:  Diagnosis Date  . Allergy   . Arthritis   . Bowel obstruction ([place])   . Colon polyps   . Diverticulitis   . Diverticulosis   . ED (erectile dysfunction)   . Elevated cholesterol   . Esophageal stricture   . GERD (gastroesophageal reflux disease)   . Headache   . High blood pressure   . Hyperlipidemia   . Hypertension   . Inflammatory bowel disease   . Migraine headache   . Pneumonia   . Rosacea     Past Surgical History:  Procedure Laterality Date  . COLONOSCOPY  2009  . NASAL SEPTUM SURGERY    . UPPER GASTROINTESTINAL ENDOSCOPY  2009    Current Outpatient Medications  Medication Sig Dispense Refill  . Azilsartan Medoxomil (EDARBI) 40 MG TABS Take 1 tablet by mouth daily. 30 tablet 12  . FIBER COMPLETE PO Take 1 tablet by mouth 2 (two) times daily.    . hydrochlorothiazide (MICROZIDE) 12.5 MG capsule Take 1 capsule (12.5 mg total) by mouth daily. 90 capsule 3   No  current facility-administered medications for this visit.     Allergies as of 07/11/2018  . (No Known Allergies)    Vitals: BP 133/90   Pulse 79   Ht 6\' 1"  (  1.854 m)   Wt 197 lb (89.4 kg)   BMI 25.99 kg/m  Last Weight:  Wt Readings from Last 1 Encounters:  07/11/18 197 lb (89.4 kg)   WUJ:WJXB mass index is 25.99 kg/m.     Last Height:   Ht Readings from Last 1 Encounters:  07/11/18 6\' 1"  (1.854 m)    Physical exam:  General: The patient is awake, alert and appears not in acute distress. The patient is well groomed. Head: Normocephalic, atraumatic. Neck is supple. Mallampati 3,  neck circumference:16. 5 . Nasal airflow patent , facial hair .   Cardiovascular:  Regular rate and rhythm , without  murmurs or carotid bruit, and without distended neck veins. Respiratory: Lungs are clear to auscultation. Skin:  Without evidence of edema, or rash Trunk: BMI is 26 The patient's posture is erect .   Neurologic exam : The patient is awake and alert, oriented to place and time.  Attention span & concentration ability appears normal.  Speech is fluent,  without dysarthria, dysphonia or aphasia.  Mood and affect are appropriate.  Cranial nerves: Pupils are equal and briskly reactive to light. Funduscopic exam without evidence of pallor or edema.  Extraocular movements  in vertical and horizontal planes intact and without nystagmus. Visual fields by finger perimetry are intact. Hearing to finger rub intact.  Facial sensation intact to fine touch. Facial motor strength is symmetric and tongue and uvula move midline. Shoulder shrug was symmetrical.   Motor exam:  Normal tone, muscle bulk and symmetric strength in all extremities. Sensory:  Fine touch, pinprick and vibration were tested in all extremities. Feet have arches, are warm- and he lost all hair up to the mid- level- darker skin colour on both feet and above to ankles, complete loss of VIBRATION - just partial numbness above the  ankle, none above the knees, not present at the fingers of either hand.  Proprioception tested in the upper extremities was normal. Coordination: Rapid alternating movements in the fingers/hands were impaired -Finger-to-nose maneuver  normal without evidence of ataxia, dysmetria or tremor. Gait and station: Patient walks without assistive device and is able unassisted to climb up to the exam table. Strength within normal limits. Stance is stable and normal. Turns with 3.5  Steps. Romberg testing is  Negative. He is able to walk on tip toes and heels.  Deep tendon reflexes: in the upper and lower extremities are symmetrically attenuated - and absent at knee and achilles tendon. Babinski maneuver response is equivocal.  Assessment:  After physical and neurologic examination, review of laboratory studies,  Personal review of imaging studies, reports of other /same  Imaging studies, results of polysomnography and / or neurophysiology testing and pre-existing records as far as provided in visit., my assessment is   1)  Symmetric peripheral bilateral Neuropathy primarily with loss of vibration, temperature and pin prick-pressure,and pain sensation. Complete loss of 4 primary modalities. Unable to feel his feet on the car's pedal- stopped driving his gear shift Miata, stopped walking bare foot.   2)  Metabolic panel, Vit J47, protein e- phoresis, thiamine, TSH and T4, ANA, ACE. Folic acid. Hba1c.     Alcohol related neuropathy. Inherited neuropathy, prediabetic ?  Marland Kitchen   3) EMG and NCV.    The patient was advised of the nature of the diagnosed disorder , the treatment options and the  risks for general health and wellness arising from not treating the condition.   I spent more than 60 minutes of  face to face time with the patient.  Greater than 50% of time was spent in counseling and coordination of care. We have discussed the diagnosis and differential and I answered the patient's questions.    Plan:   Treatment plan and additional workup : neuropathy work up.  RV in 2-3 month with NP      Larey Seat, MD 12/05/7626, 3:15 PM  Certified in Neurology by ABPN Certified in [place] by [facility] [address] [place], [place] [address]

## 2018-07-11 NOTE — Patient Instructions (Signed)
Neuropathic Pain Neuropathic pain is pain caused by damage to the nerves that are responsible for certain sensations in your body (sensory nerves). The pain can be caused by damage to:  The sensory nerves that send signals to your spinal cord and brain (peripheral nervous system).  The sensory nerves in your brain or spinal cord (central nervous system).  Neuropathic pain can make you more sensitive to pain. What would be a minor sensation for most people may feel very painful if you have neuropathic pain. This is usually a long-term condition that can be difficult to treat. The type of pain can differ from person to person. It may start suddenly (acute), or it may develop slowly and last for a long time (chronic). Neuropathic pain may come and go as damaged nerves heal or may stay at the same level for years. It often causes emotional distress, loss of sleep, and a lower quality of life. What are the causes? The most common cause of damage to a sensory nerve is diabetes. Many other diseases and conditions can also cause neuropathic pain. Causes of neuropathic pain can be classified as:  Toxic. Many drugs and chemicals can cause toxic damage. The most common cause of toxic neuropathic pain is damage from drug treatment for cancer (chemotherapy).  Metabolic. This type of pain can happen when a disease causes imbalances that damage nerves. Diabetes is the most common of these diseases. Vitamin B deficiency caused by long-term alcohol abuse is another common cause.  Traumatic. Any injury that cuts, crushes, or stretches a nerve can cause damage and pain. A common example is feeling pain after losing an arm or leg (phantom limb pain).  Compression-related. If a sensory nerve gets trapped or compressed for a long period of time, the blood supply to the nerve can be cut off.  Vascular. Many blood vessel diseases can cause neuropathic pain by decreasing blood supply and oxygen to nerves.  Autoimmune.  This type of pain results from diseases in which the body's defense system mistakenly attacks sensory nerves. Examples of autoimmune diseases that can cause neuropathic pain include lupus and multiple sclerosis.  Infectious. Many types of viral infections can damage sensory nerves and cause pain. Shingles infection is a common cause of this type of pain.  Inherited. Neuropathic pain can be a symptom of many diseases that are passed down through families (genetic).  What are the signs or symptoms? The main symptom is pain. Neuropathic pain is often described as:  Burning.  Shock-like.  Stinging.  Hot or cold.  Itching.  How is this diagnosed? No single test can diagnose neuropathic pain. Your health care provider will do a physical exam and ask you about your pain. You may use a pain scale to describe how bad your pain is. You may also have tests to see if you have a high sensitivity to pain and to help find the cause and location of any sensory nerve damage. These tests may include:  Imaging studies, such as: ? X-rays. ? CT scan. ? MRI.  Nerve conduction studies to test how well nerve signals travel through your sensory nerves (electrodiagnostic testing).  Stimulating your sensory nerves through electrodes on your skin and measuring the response in your spinal cord and brain (somatosensory evoked potentials).  How is this treated? Treatment for neuropathic pain may change over time. You may need to try different treatment options or a combination of treatments. Some options include:  Over-the-counter pain relievers.  Prescription medicines. Some medicines   used to treat other conditions may also help neuropathic pain. These include medicines to: ? Control seizures (anticonvulsants). ? Relieve depression (antidepressants).  Prescription-strength pain relievers (narcotics). These are usually used when other pain relievers do not help.  Transcutaneous nerve stimulation (TENS).  This uses electrical currents to block painful nerve signals. The treatment is painless.  Topical and local anesthetics. These are medicines that numb the nerves. They can be injected as a nerve block or applied to the skin.  Alternative treatments, such as: ? Acupuncture. ? Meditation. ? Massage. ? Physical therapy. ? Pain management programs. ? Counseling.  Follow these instructions at home:  Learn as much as you can about your condition.  Take medicines only as directed by your health care provider.  Work closely with all your health care providers to find what works best for you.  Have a good support system at home.  Consider joining a chronic pain support group. Contact a health care provider if:  Your pain treatments are not helping.  You are having side effects from your medicines.  You are struggling with fatigue, mood changes, depression, or anxiety. This information is not intended to replace advice given to you by your health care provider. Make sure you discuss any questions you have with your health care provider. Document Released: 07/16/2004 Document Revised: 05/08/2016 Document Reviewed: 03/29/2014 Elsevier Interactive Patient Education  2018 Elsevier Inc.  

## 2018-07-13 ENCOUNTER — Telehealth: Payer: Self-pay | Admitting: Neurology

## 2018-07-13 NOTE — Telephone Encounter (Signed)
-----   Message from Larey Seat, MD sent at 07/12/2018  4:45 PM EDT ----- Normal, high calcium can be dietary related.  Normal protein electrophoresis, B1, ANA, B 12, TSH and T4 -

## 2018-07-13 NOTE — Telephone Encounter (Signed)
Called the patient and reviewed the lab work with him. Advised that they were normal and Dr Brett Fairy didn't have any concerns. Pt verbalized understanding. Pt had no questions at this time but was encouraged to call back if questions arise. Pt did ask his apt time for the upcoming NCV/EMG and I informed him of that time and date. Pt was appreciative.

## 2018-07-15 ENCOUNTER — Telehealth: Payer: Self-pay | Admitting: *Deleted

## 2018-07-15 LAB — COMPREHENSIVE METABOLIC PANEL
ALT: 28 IU/L (ref 0–44)
AST: 24 IU/L (ref 0–40)
Albumin/Globulin Ratio: 1.8 (ref 1.2–2.2)
Albumin: 4.6 g/dL (ref 3.6–4.8)
Alkaline Phosphatase: 108 IU/L (ref 39–117)
BUN/Creatinine Ratio: 12 (ref 10–24)
BUN: 9 mg/dL (ref 8–27)
Bilirubin Total: 0.4 mg/dL (ref 0.0–1.2)
CALCIUM: 10.3 mg/dL — AB (ref 8.6–10.2)
CO2: 23 mmol/L (ref 20–29)
CREATININE: 0.76 mg/dL (ref 0.76–1.27)
Chloride: 97 mmol/L (ref 96–106)
GFR calc Af Amer: 115 mL/min/{1.73_m2} (ref >59)
GFR, EST NON AFRICAN AMERICAN: 99 mL/min/{1.73_m2} (ref >59)
GLOBULIN, TOTAL: 2.6 g/dL (ref 1.5–4.5)
GLUCOSE: 88 mg/dL (ref 65–99)
Potassium: 4.5 mmol/L (ref 3.5–5.2)
Sodium: 138 mmol/L (ref 134–144)
Total Protein: 7.2 g/dL (ref 6.0–8.5)

## 2018-07-15 LAB — PROTEIN ELECTROPHORESIS, SERUM
A/G RATIO SPE: 1.2 (ref 0.7–1.7)
ALBUMIN ELP: 3.9 g/dL (ref 2.9–4.4)
Alpha 1: 0.2 g/dL (ref 0.0–0.4)
Alpha 2: 0.8 g/dL (ref 0.4–1.0)
Beta: 1.3 g/dL (ref 0.7–1.3)
GLOBULIN, TOTAL: 3.3 g/dL (ref 2.2–3.9)
Gamma Globulin: 0.9 g/dL (ref 0.4–1.8)

## 2018-07-15 LAB — TSH+FREE T4
FREE T4: 1.13 ng/dL (ref 0.82–1.77)
TSH: 1.64 u[IU]/mL (ref 0.450–4.500)

## 2018-07-15 LAB — VITAMIN B1: Thiamine: 191.6 nmol/L (ref 66.5–200.0)

## 2018-07-15 LAB — METHYLMALONIC ACID, SERUM: Methylmalonic Acid: 137 nmol/L (ref 0–378)

## 2018-07-15 LAB — SEDIMENTATION RATE: SED RATE: 11 mm/h (ref 0–30)

## 2018-07-15 LAB — HEMOGLOBIN A1C
ESTIMATED AVERAGE GLUCOSE: 126 mg/dL
Hgb A1c MFr Bld: 6 % — ABNORMAL HIGH (ref 4.8–5.6)

## 2018-07-15 LAB — C-REACTIVE PROTEIN: CRP: 4 mg/L (ref 0–10)

## 2018-07-15 LAB — ANA W/REFLEX IF POSITIVE: Anti Nuclear Antibody(ANA): NEGATIVE

## 2018-07-15 NOTE — Telephone Encounter (Addendum)
Spoke with pt and informed him that his calcium is mildly elevated and all his autoimmune markers were normal. These results have been sent to Dr. Redmond School. Pt verbalized appreciation and had no questions.   ----- Message from Larey Seat, MD sent at 07/15/2018 10:28 AM EDT ----- Mildly elevated calcium - all autoimmune markers were normal.

## 2018-08-11 ENCOUNTER — Encounter: Payer: 59 | Admitting: Diagnostic Neuroimaging

## 2018-08-11 ENCOUNTER — Ambulatory Visit (INDEPENDENT_AMBULATORY_CARE_PROVIDER_SITE_OTHER): Payer: 59 | Admitting: Diagnostic Neuroimaging

## 2018-08-11 DIAGNOSIS — E569 Vitamin deficiency, unspecified: Secondary | ICD-10-CM

## 2018-08-11 DIAGNOSIS — R200 Anesthesia of skin: Secondary | ICD-10-CM

## 2018-08-11 DIAGNOSIS — M21379 Foot drop, unspecified foot: Secondary | ICD-10-CM

## 2018-08-11 DIAGNOSIS — D519 Vitamin B12 deficiency anemia, unspecified: Secondary | ICD-10-CM

## 2018-08-11 DIAGNOSIS — Z0289 Encounter for other administrative examinations: Secondary | ICD-10-CM

## 2018-08-16 NOTE — Procedures (Signed)
[facility]  NCS (NERVE CONDUCTION STUDY) WITH EMG (ELECTROMYOGRAPHY) REPORT   STUDY DATE: 08/11/18 PATIENT NAME: Micheal Hodge DOB: Mar 14, 1956 MRN: [id number]  ORDERING CLINICIAN: Larey Seat, MD   TECHNOLOGIST: Oneita Jolly  ELECTROMYOGRAPHER: Earlean Polka. Delcie Ruppert, MD  CLINICAL INFORMATION: 62 year old male with lower extremity numbness. Evaluate for neuropathy.  FINDINGS: NERVE CONDUCTION STUDY:  Left median and left ulnar motor responses are normal.  Bilateral peroneal motor responses of normal distal latencies, amplitudes, slow conduction velocities.  Bilateral tibial motor responses have normal latencies, decreased amplitudes, slow conduction velocities (left 35 m/s, right 28 m/s).   Left ulnar F wave latency is normal.  Bilateral tibial F wave latencies are significantly prolonged (right 71 ms, left 77 ms).  Bilateral sural and superficial peroneal sensory responses could not be obtained.  Left median and left ulnar sensory responses are normal.   NEEDLE ELECTROMYOGRAPHY:  Right vastus medialis, tibials anterior, gastrocnemius and right lumbar paraspinal muscles are normal.   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    INTERPRETING PHYSICIAN:  Penni Bombard, MD Certified in Neurology, Neurophysiology and Neuroimaging  [facility] [address] [place], [place] [address] [phone]   [facility]    Nerve / Sites Muscle Latency Ref. Amplitude Ref. Rel Amp Segments Distance Velocity Ref. Area    ms ms mV mV %  cm m/s m/s mVms  L Median - APB     Wrist APB 3.2 ?4.4 7.7 ?4.0 100 Wrist - APB 7   24.2     Upper arm APB 8.1  7.4  96.1 Upper arm - Wrist 24 50 ?49 23.9  L Ulnar - ADM     Wrist ADM 2.7 ?3.3 11.0 ?6.0 100 Wrist - ADM 7   24.7     B.Elbow ADM 7.1  10.0  91.4 B.Elbow - Wrist 23 52 ?49 24.0     A.Elbow ADM 9.0  9.4  93.9  A.Elbow - B.Elbow 10 53 ?49 23.5         A.Elbow - Wrist      L Peroneal - EDB     Ankle EDB 5.2 ?6.5 2.8 ?2.0 100 Ankle - EDB 9   9.6     Fib head EDB 14.2  1.9  69.5 Fib head - Ankle 30 33 ?44 7.0     Pop fossa EDB 16.8  2.3  121 Pop fossa - Fib head 10 38 ?44 8.6         Pop fossa - Ankle      R Peroneal - EDB     Ankle EDB 5.1 ?6.5 2.9 ?2.0 100 Ankle - EDB 9   8.0     Fib head EDB 14.4  2.4  83 Fib head - Ankle 30 32 ?44 7.4     Pop fossa EDB 16.7  2.3  95.4 Pop fossa - Fib head 10 44 ?44 7.5         Pop fossa - Ankle      L Tibial - AH     Ankle AH 5.0 ?5.8 0.5 ?4.0 100 Ankle - AH 9   1.5     Pop fossa AH 16.5  0.4  77.8 Pop fossa - Ankle 40 35 ?41 1.8  R Tibial - AH     Ankle AH 5.8 ?5.8 0.5 ?4.0 100 Ankle - AH 9   1.1     Pop fossa AH 19.8  0.4  82.1 Pop fossa - Ankle 40 28 ?41 1.3                  SNC    Nerve / Sites Rec. Site Peak Lat Ref.  Amp Ref. Segments Distance    ms ms V V  cm  L Sural - Ankle (Calf)     Calf Ankle NR ?4.4 NR ?6 Calf - Ankle 14  R Sural - Ankle (Calf)     Calf Ankle NR ?4.4 NR ?6 Calf - Ankle 14  L Superficial peroneal - Ankle     Lat leg Ankle NR ?4.4 NR ?6 Lat leg - Ankle 14  R Superficial peroneal - Ankle     Lat leg Ankle NR ?4.4 NR ?6 Lat leg - Ankle 14  L Median - Orthodromic (Dig II, Mid palm)     Dig II Wrist 3.2 ?3.4 15 ?10 Dig II - Wrist 13  L Ulnar - Orthodromic, (Dig V, Mid palm)     Dig V Wrist 2.9 ?3.1 6 ?5 Dig V - Wrist 17                  F  Wave    Nerve F Lat Ref.   ms ms  L Tibial - AH 76.5 ?56.0  L Ulnar - ADM 31.8 ?32.0  R Tibial - AH 70.7 ?56.0              EMG full       EMG Summary Table    Spontaneous MUAP Recruitment  Muscle IA Fib PSW Fasc Other Amp Dur. Poly Pattern  R. Vastus medialis Normal None None None _______ Normal Normal Normal Normal  R. Tibialis anterior Normal None None None _______ Normal Normal Normal Normal  R. Gastrocnemius (Medial head) Normal None None None _______ Normal Normal  Normal Normal  R. Lumbar paraspinals Normal None None None _______ Normal Normal Normal Normal

## 2018-08-18 ENCOUNTER — Telehealth: Payer: Self-pay | Admitting: Neurology

## 2018-08-18 ENCOUNTER — Other Ambulatory Visit: Payer: Self-pay | Admitting: Neurology

## 2018-08-18 DIAGNOSIS — G63 Polyneuropathy in diseases classified elsewhere: Principal | ICD-10-CM

## 2018-08-18 DIAGNOSIS — M21379 Foot drop, unspecified foot: Secondary | ICD-10-CM

## 2018-08-18 DIAGNOSIS — E889 Metabolic disorder, unspecified: Secondary | ICD-10-CM

## 2018-08-18 DIAGNOSIS — D519 Vitamin B12 deficiency anemia, unspecified: Secondary | ICD-10-CM

## 2018-08-18 DIAGNOSIS — E569 Vitamin deficiency, unspecified: Secondary | ICD-10-CM

## 2018-08-18 NOTE — Addendum Note (Signed)
Addended by: Gerline Legacy C on: 08/18/2018 04:00 PM   Modules accepted: Orders

## 2018-08-18 NOTE — Telephone Encounter (Signed)
Demyelinating axonal sensory neuropathy can be iatrogenic.  There are some medications that may promote it. There are genetic condition -Charcot Marie Tooth type 1 / Refsum's disease, that are manifesting this way- and toxic effects , such as from alcohol use, and Vitamin deficiencies (within the  * B complex) . Metanx is a methylated Vit B 12 form that reduces neuropathy symptoms.  I like to ask patient to start a multivitamin complex- I recommend prenatal vitamins, because of the Vit b concentration.   We can treat symptomatically when it comes to pain, but a numb non- painful neuropathy doesn't benefit from these medications.   I will send an additional note after speaking with Dr. Lorelee New, MD.

## 2018-08-18 NOTE — Telephone Encounter (Signed)
Called the patient to review what Dr Brett Fairy has advised. Informed the patient that she would like to proceed forward with testing by completing a Lumbar Puncture.  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  The pt didn't answer. LVM informing him that we will go ahead and place the order for the LP and that someone will be calling to get him scheduled. Instructed the patient to call me with any questions that he may have.

## 2018-08-18 NOTE — Telephone Encounter (Signed)
Next step is an LP- CSF for cyto-albuminic dissociation.  Schedule at [place], please.   We need cells, diff, protein and glucose. Oligos.

## 2018-08-18 NOTE — Telephone Encounter (Signed)
Pt states he see's the results of his test, he is asking for a call with the next plan of action for treatment

## 2018-08-18 NOTE — Telephone Encounter (Signed)
Called the patient and went over the results with him. Advised that Dr Brett Fairy is out of the office. Informed him that I would touch base with her and see what her treatment plan or what the next step would be. Also informed the patient that he needed a follow up apt scheduled. Scheduled apt for 10/04/2018 at 8:30 am. Informed him that I would call him as soon as discussed with Dr Brett Fairy on what her recommendation would be and if we can get treatment going for him prior to his follow up apt. Pt verbalized understanding and was appreciative.

## 2018-08-31 ENCOUNTER — Ambulatory Visit
Admission: RE | Admit: 2018-08-31 | Discharge: 2018-08-31 | Disposition: A | Discharge status: Home / Self Care | Payer: 59 | Source: Ambulatory Visit | Attending: Neurology | Admitting: Neurology

## 2018-08-31 VITALS — BP 124/69 | HR 72

## 2018-08-31 DIAGNOSIS — D519 Vitamin B12 deficiency anemia, unspecified: Secondary | ICD-10-CM | POA: Diagnosis not present

## 2018-08-31 DIAGNOSIS — E569 Vitamin deficiency, unspecified: Secondary | ICD-10-CM

## 2018-08-31 DIAGNOSIS — M21379 Foot drop, unspecified foot: Secondary | ICD-10-CM | POA: Diagnosis not present

## 2018-08-31 DIAGNOSIS — G9009 Other idiopathic peripheral autonomic neuropathy: Secondary | ICD-10-CM | POA: Diagnosis not present

## 2018-08-31 DIAGNOSIS — G63 Polyneuropathy in diseases classified elsewhere: Principal | ICD-10-CM

## 2018-08-31 DIAGNOSIS — E889 Metabolic disorder, unspecified: Secondary | ICD-10-CM

## 2018-08-31 NOTE — Discharge Instructions (Signed)

## 2018-08-31 NOTE — Progress Notes (Signed)
Blood obtained from pt's L AC for LP labs. Pt tolerated procedure well. Site is unremarkable. 

## 2018-09-01 ENCOUNTER — Telehealth: Payer: Self-pay | Admitting: *Deleted

## 2018-09-01 NOTE — Telephone Encounter (Signed)
Received fax from [facility], re: CSF results. Oligoclonal bands and culture are pending. Sent to Dr Brett Fairy for review.

## 2018-09-01 NOTE — Telephone Encounter (Signed)
Per Dr Brett Fairy, spoke with patient and informed him that some results are back from his LP. Per Dr Brett Fairy, there are 3 WBCs and mild elevation in protein which are not significant findings. Advised him she stated it does not appear he has CIDP. Other lab results are pending, and he'll get a call when those results are available. He verbalized understanding, appreciation of call.

## 2018-09-01 NOTE — Telephone Encounter (Signed)
If all looks normal will have to do a skin biopsy.

## 2018-09-02 ENCOUNTER — Telehealth: Payer: Self-pay | Admitting: Neurology

## 2018-09-02 ENCOUNTER — Ambulatory Visit
Admission: RE | Admit: 2018-09-02 | Discharge: 2018-09-02 | Disposition: A | Discharge status: Home / Self Care | Payer: 59 | Source: Ambulatory Visit | Attending: Neurology | Admitting: Neurology

## 2018-09-02 DIAGNOSIS — G63 Polyneuropathy in diseases classified elsewhere: Principal | ICD-10-CM

## 2018-09-02 DIAGNOSIS — G971 Other reaction to spinal and lumbar puncture: Secondary | ICD-10-CM | POA: Diagnosis not present

## 2018-09-02 DIAGNOSIS — E889 Metabolic disorder, unspecified: Secondary | ICD-10-CM

## 2018-09-02 MED ORDER — IOPAMIDOL (ISOVUE-M 200) INJECTION 41%
1.0000 mL | Freq: Once | INTRAMUSCULAR | Status: AC
  Administered 2018-09-02: 1 mL via EPIDURAL

## 2018-09-02 NOTE — Progress Notes (Signed)
Blood obtained from pt's L AC for blood patch. Pt tolerated procedure well. Site is unremarkable. 

## 2018-09-02 NOTE — Telephone Encounter (Signed)
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  He was doing ok until this am at 1000.  He is c/o headache, nausea, having some issue with muffled hearing when sitting up.  These sz get better when he is lying down.  He states has been keeping hydrated and drinking caffeine products.  He may need blood patch.

## 2018-09-02 NOTE — Telephone Encounter (Signed)
Spoke with patient and informed him that Dr Brett Fairy stated if all CSF labs are normal she will order a skin biopsy. Advised him that this office has a dr on call on weekends should he need the service.Marland Kitchen He verbalized understanding, appreciation.

## 2018-09-02 NOTE — Telephone Encounter (Signed)
Pt has called to inform on Wed of this week he had a Lumbar Puncture, right until 10 this morning pt felt fine.  Pt now asking if he should go home and rest or seek medical attention.  Pt states his head is hurting, his is nauseated having a weird sensation of feeling like his head is under water.  Pt is asking to be called

## 2018-09-02 NOTE — Telephone Encounter (Signed)
Spoke to pt and and relayed that order placed for blood patch (VO from Dr. Jaynee Eagles).  Cosigned order from Dr. Krista Blue.    He lives in [place] and works in [place], [place].  I told him would need someone with him when has blood patch.  Hopefully today.  I LMVM for Roberta at [facility] about pt.  I gave him the number [phone] at [facility] as well.  He verbalized udnerstanding.

## 2018-09-02 NOTE — Discharge Instructions (Signed)

## 2018-09-02 NOTE — Addendum Note (Signed)
Addended by: Oliver Hum S on: 09/02/2018 01:14 PM   Modules accepted: Orders

## 2018-09-04 LAB — OLIGOCLONAL BANDS, CSF + SERM

## 2018-09-04 LAB — CSF CELL COUNT WITH DIFFERENTIAL
RBC COUNT CSF: 0 {cells}/uL (ref 0–10)
WBC CSF: 3 {cells}/uL (ref 0–5)

## 2018-09-04 LAB — CSF CULTURE W GRAM STAIN
MICRO NUMBER:: 91305688
Result:: NO GROWTH
SPECIMEN QUALITY:: ADEQUATE

## 2018-09-04 LAB — GLUCOSE, CSF: GLUCOSE CSF: 63 mg/dL (ref 40–80)

## 2018-09-04 LAB — PROTEIN, CSF: TOTAL PROTEIN, CSF: 49 mg/dL — AB (ref 15–45)

## 2018-09-07 ENCOUNTER — Telehealth: Payer: Self-pay | Admitting: Neurology

## 2018-09-07 ENCOUNTER — Encounter: Payer: Self-pay | Admitting: Neurology

## 2018-09-07 DIAGNOSIS — G629 Polyneuropathy, unspecified: Secondary | ICD-10-CM

## 2018-09-07 DIAGNOSIS — R739 Hyperglycemia, unspecified: Secondary | ICD-10-CM

## 2018-09-07 HISTORY — DX: Hyperglycemia, unspecified: R73.9

## 2018-09-07 NOTE — Telephone Encounter (Signed)
Called the patient and reviewed with him the findings that Dr Dohmeier reviewed. Informed him that this is not likely to be an auto immune process like she was initially working it up to be. She states that she would recommend taking  Vitamin b complex and thiamine vitamin otc and also maintaining glucose control. Informed the patient that she would offer a second opinion to [facility] for patient if he would like. Pt currently has a f/u apt here scheduled 12/3. At this time we will keep the apt and the patient would like to think on this 2nd opinion referral. Patient will contact us back and let me know how he would like to proceed forward. I advised the patient I would place on wait list and if I see a cancellation come open I can give him a call to offer that to him. Patient verbalized understanding and will call back if he would like the referral to be placed.

## 2018-09-07 NOTE — Telephone Encounter (Signed)
-----   Message from Larey Seat, MD sent at 09/05/2018  8:00 AM EST ----- No oligoclonal bands found. Not likely to be an autoimmune process. Given there is only a mild increase in protein , I would not think of this as a CIDP/ AIDP and recommend an evaluation with my neuromuscular colleagues, possible skin biopsy.

## 2018-09-07 NOTE — Telephone Encounter (Signed)
Methylmalonic acid was normal, not likely a vit B 12 deficiency . Normal CSF results, slight CSF protein increase is non specific.  Hb Aic was elevated - this can be diabetic neuropathy. Recommend Vit B complex, thiamine and best possible glucose control.   Dr Leta Baptist declined second opinion visit, he recommended tertiary care center for further work up, if desired. I will be happy to refer to [facility] .     Cc Dr Redmond School.   Larey Seat, MD

## 2018-09-08 NOTE — Telephone Encounter (Signed)
Pt has called back to make the request that the referral be sent to [facility] because that would be a lot closer for pt instead of [facility], please call

## 2018-09-08 NOTE — Telephone Encounter (Signed)
Called the patient back to see what he had decided. Patient would like to cancel the upcoming apt with [place] and then he would like the referral to be placed. Advised the patient I would inform Dr Brett Fairy of what he decided and she will place the referral for him. Advised that it may take a couple weeks to hear from their department but that we would send the referral over and he would need to be on lookout from a call from [facility]. Pt verbalized understanding and was appreciative. Encouraged once more Dr Dohmeier's recommendations and he verbalized understanding.

## 2018-09-08 NOTE — Addendum Note (Signed)
Addended by: Larey Seat on: 09/08/2018 05:04 PM   Modules accepted: Orders

## 2018-09-08 NOTE — Telephone Encounter (Signed)
Pt requesting a call, stating he would like to discuss canceling his appt and moving forward with the referral

## 2018-09-08 NOTE — Telephone Encounter (Signed)
Will refer to [facility].

## 2018-10-04 ENCOUNTER — Ambulatory Visit: Payer: Self-pay | Admitting: Neurology

## 2018-11-22 DIAGNOSIS — G629 Polyneuropathy, unspecified: Secondary | ICD-10-CM | POA: Diagnosis not present

## 2019-02-16 ENCOUNTER — Telehealth: Payer: Self-pay | Admitting: Family Medicine

## 2019-02-16 ENCOUNTER — Other Ambulatory Visit: Payer: Self-pay

## 2019-02-16 DIAGNOSIS — I10 Essential (primary) hypertension: Secondary | ICD-10-CM

## 2019-02-16 MED ORDER — AZILSARTAN MEDOXOMIL 40 MG PO TABS: 1.0000 | ORAL_TABLET | Freq: Every day | ORAL | 12 refills | Status: DC

## 2019-02-16 MED ORDER — HYDROCHLOROTHIAZIDE 12.5 MG PO CAPS: 12.5000 mg | ORAL_CAPSULE | Freq: Every day | ORAL | 3 refills | Status: DC

## 2019-02-16 NOTE — Telephone Encounter (Signed)
Pt left message needs refills HCTZ & Earnest Rosier

## 2019-02-18 NOTE — Telephone Encounter (Signed)
done

## 2019-02-20 ENCOUNTER — Other Ambulatory Visit: Payer: Self-pay

## 2019-02-20 DIAGNOSIS — I10 Essential (primary) hypertension: Secondary | ICD-10-CM

## 2019-02-20 MED ORDER — HYDROCHLOROTHIAZIDE 12.5 MG PO CAPS: 12.5000 mg | ORAL_CAPSULE | Freq: Every day | ORAL | 3 refills | Status: DC

## 2019-02-20 MED ORDER — AZILSARTAN MEDOXOMIL 40 MG PO TABS: 1.0000 | ORAL_TABLET | Freq: Every day | ORAL | 12 refills | Status: DC

## 2019-02-20 NOTE — Telephone Encounter (Signed)
LVM for pt to advise his pharmacy was changed [facility]

## 2019-11-28 IMAGING — XA DG FLUORO GUIDE LUMBAR PUNCTURE
2 series · 2 of 2 positions shown · non-contrast
Comparison: none

CLINICAL DATA: Peripheral neuropathy due to metabolic disorder.
Foot drop. Anemia due to vitamin-B deficiency. CIDP.

[Series 1: ortho standard · 1 of 1 slices shown (1 of 2)]
[im 1/1]
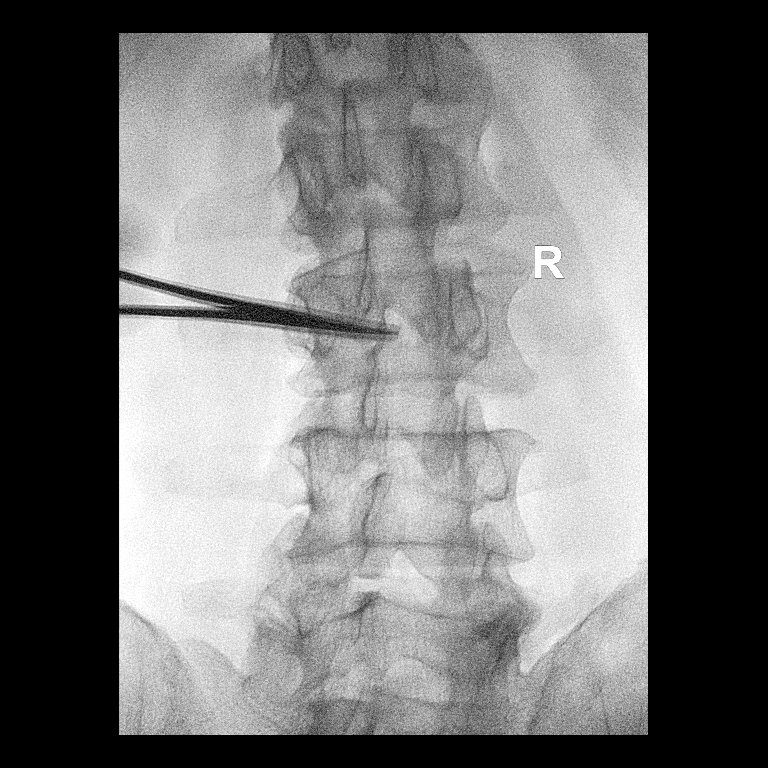

[Series 2: ortho standard · 1 of 1 slices shown (2 of 2)]
[im 1/1]
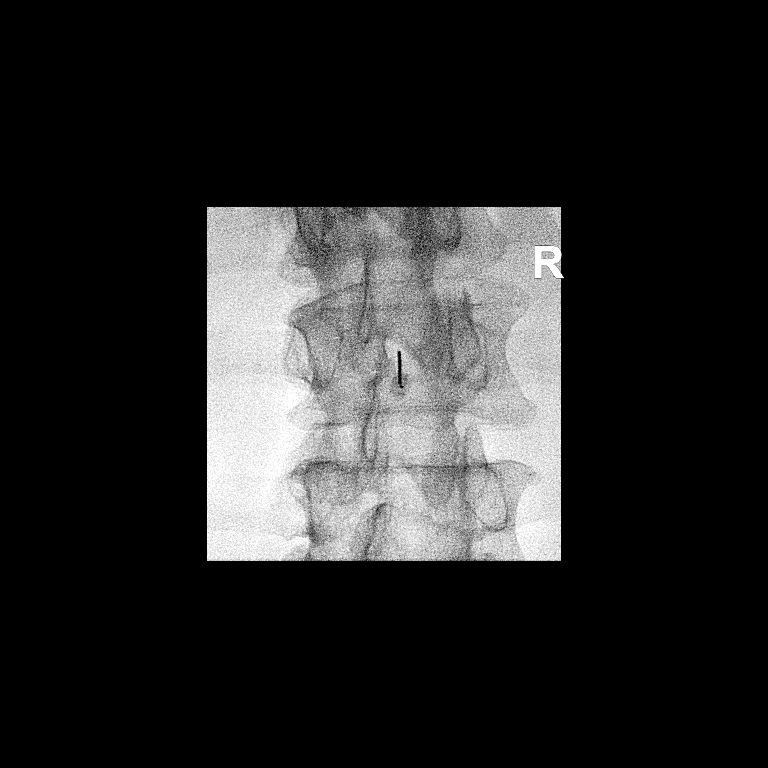

[2 of 2 positions shown; findings below may reference images not displayed]

EXAM:
DIAGNOSTIC LUMBAR PUNCTURE UNDER FLUOROSCOPIC GUIDANCE

FLUOROSCOPY TIME:  Fluoroscopy Time:  4 seconds

Radiation Exposure Index (if provided by the fluoroscopic device):
5.51 uGy*m2

Number of Acquired Spot Images: 0

PROCEDURE:
Informed consent was obtained from the patient prior to the
procedure, including potential complications of headache, allergy,
and pain. With the patient prone, the lower back was prepped with
Betadine. 1% Lidocaine was used for local anesthesia. Lumbar
puncture was performed at the right L2-3 level using a 20 gauge
needle with return of clear CSF with an opening pressure of 20.5 cm
water. 4.0 ml of CSF were obtained for laboratory studies. The
patient tolerated the procedure well and there were no apparent
complications.
IMPRESSION: 1. Technically successful fluoroscopic guided lumbar puncture via a
right paramedian approach at L2-3.
2. Borderline elevation of opening pressure at 20.5 cm water.

## 2020-03-08 ENCOUNTER — Other Ambulatory Visit: Payer: Self-pay | Admitting: Family Medicine

## 2020-03-08 DIAGNOSIS — I10 Essential (primary) hypertension: Secondary | ICD-10-CM

## 2020-03-08 NOTE — Telephone Encounter (Signed)
Pt has not been in since 12/2017. Called pt and advised to call back to cpe

## 2020-03-12 ENCOUNTER — Other Ambulatory Visit: Payer: Self-pay

## 2020-03-12 ENCOUNTER — Telehealth: Payer: Self-pay | Admitting: Family Medicine

## 2020-03-12 ENCOUNTER — Ambulatory Visit: Payer: 59 | Admitting: Family Medicine

## 2020-03-12 ENCOUNTER — Encounter: Payer: Self-pay | Admitting: Family Medicine

## 2020-03-12 VITALS — BP 110/78 | HR 85 | Temp 97.8°F | Wt 213.8 lb

## 2020-03-12 DIAGNOSIS — M545 Low back pain, unspecified: Secondary | ICD-10-CM

## 2020-03-12 DIAGNOSIS — I10 Essential (primary) hypertension: Secondary | ICD-10-CM

## 2020-03-12 DIAGNOSIS — Z1211 Encounter for screening for malignant neoplasm of colon: Secondary | ICD-10-CM

## 2020-03-12 DIAGNOSIS — Z23 Encounter for immunization: Secondary | ICD-10-CM

## 2020-03-12 DIAGNOSIS — Z13220 Encounter for screening for lipoid disorders: Secondary | ICD-10-CM

## 2020-03-12 DIAGNOSIS — R739 Hyperglycemia, unspecified: Secondary | ICD-10-CM

## 2020-03-12 DIAGNOSIS — G6289 Other specified polyneuropathies: Secondary | ICD-10-CM | POA: Diagnosis not present

## 2020-03-12 DIAGNOSIS — Z8719 Personal history of other diseases of the digestive system: Secondary | ICD-10-CM

## 2020-03-12 DIAGNOSIS — K219 Gastro-esophageal reflux disease without esophagitis: Secondary | ICD-10-CM

## 2020-03-12 DIAGNOSIS — G8929 Other chronic pain: Secondary | ICD-10-CM

## 2020-03-12 DIAGNOSIS — Z87891 Personal history of nicotine dependence: Secondary | ICD-10-CM

## 2020-03-12 DIAGNOSIS — Z8669 Personal history of other diseases of the nervous system and sense organs: Secondary | ICD-10-CM

## 2020-03-12 MED ORDER — EDARBI 40 MG PO TABS: 1.0000 | ORAL_TABLET | Freq: Every day | ORAL | 12 refills | Status: DC

## 2020-03-12 NOTE — Telephone Encounter (Signed)
Pt called requesting a refill on his edarbi please send to the [place], [place]

## 2020-03-12 NOTE — Progress Notes (Signed)
   Subjective:    Patient ID: Micheal Hodge, male    DOB: 07-05-1956, 64 y.o.   MRN: [id number]  HPI He is here for an interval evaluation.  He has enjoyed excellent health.  He does have a peripheral neuropathy however it does not cause very much trouble.  He continues on his blood pressure medication without trouble.  He does have a history of hyperlipidemia but presently is not on any medication.  His reflux is not causing any trouble.  He has had no dysphagia or feeling of food getting stuck.  He has not had a migraine in quite some time.  His low back pain is causing very little difficulty  There is also a previous history of elevated blood sugar.  His work is going well.  His father and brother are sharing the home with him.  His son has been moved into an assisted living facility.  Smoking and drinking as well as personal habits were reviewed.   Review of Systems     Objective:   Physical Exam Alert and in no distress. Tympanic membranes and canals are normal. Pharyngeal area is normal. Neck is supple without adenopathy or thyromegaly. Cardiac exam shows a regular sinus rhythm without murmurs or gallops. Lungs are clear to auscultation.        Assessment & Plan:  Essential hypertension - Plan: CBC with Differential/Platelet, Comprehensive metabolic panel  Need for shingles vaccine - Plan: Varicella-zoster vaccine IM (Shingrix)  Other polyneuropathy  Chronic low back pain, unspecified back pain laterality, unspecified whether sciatica present  Blood glucose elevated  History of migraine headaches  History of esophageal stricture  Gastroesophageal reflux disease without esophagitis  Former smoker  Screening for colon cancer - Plan: Cologuard  Screening for lipid disorders - Plan: Lipid panel  At this time he is doing quite well.  I will order Cologuard to get him caught up.  We will also check other lipids.  Encouraged him to continue to take good care of himself.   Recheck here in several months for repeat Shingrix.

## 2020-03-12 NOTE — Addendum Note (Signed)
Addended by: Denita Lung on: 03/12/2020 08:49 PM   Modules accepted: Orders

## 2020-03-13 ENCOUNTER — Encounter: Payer: Self-pay | Admitting: Family Medicine

## 2020-03-13 LAB — COMPREHENSIVE METABOLIC PANEL
ALT: 21 IU/L (ref 0–44)
AST: 19 IU/L (ref 0–40)
Albumin/Globulin Ratio: 1.4 (ref 1.2–2.2)
Albumin: 4.5 g/dL (ref 3.8–4.8)
Alkaline Phosphatase: 108 IU/L (ref 39–117)
BUN/Creatinine Ratio: 18 (ref 10–24)
BUN: 15 mg/dL (ref 8–27)
Bilirubin Total: 0.8 mg/dL (ref 0.0–1.2)
CO2: 27 mmol/L (ref 20–29)
Calcium: 10 mg/dL (ref 8.6–10.2)
Chloride: 96 mmol/L (ref 96–106)
Creatinine, Ser: 0.85 mg/dL (ref 0.76–1.27)
GFR calc Af Amer: 108 mL/min/{1.73_m2} (ref >59)
GFR calc non Af Amer: 93 mL/min/{1.73_m2} (ref >59)
Globulin, Total: 3.2 g/dL (ref 1.5–4.5)
Glucose: 99 mg/dL (ref 65–99)
Potassium: 4.4 mmol/L (ref 3.5–5.2)
Sodium: 136 mmol/L (ref 134–144)
Total Protein: 7.7 g/dL (ref 6.0–8.5)

## 2020-03-13 LAB — CBC WITH DIFFERENTIAL/PLATELET
Basophils Absolute: 0.1 10*3/uL (ref 0.0–0.2)
Basos: 1 %
EOS (ABSOLUTE): 0.2 10*3/uL (ref 0.0–0.4)
Eos: 2 %
Hematocrit: 48.8 % (ref 37.5–51.0)
Hemoglobin: 16.7 g/dL (ref 13.0–17.7)
Immature Grans (Abs): 0.1 10*3/uL (ref 0.0–0.1)
Immature Granulocytes: 1 %
Lymphocytes Absolute: 3 10*3/uL (ref 0.7–3.1)
Lymphs: 30 %
MCH: 30.1 pg (ref 26.6–33.0)
MCHC: 34.2 g/dL (ref 31.5–35.7)
MCV: 88 fL (ref 79–97)
Monocytes Absolute: 0.8 10*3/uL (ref 0.1–0.9)
Monocytes: 8 %
Neutrophils Absolute: 5.8 10*3/uL (ref 1.4–7.0)
Neutrophils: 58 %
Platelets: 369 10*3/uL (ref 150–450)
RBC: 5.54 x10E6/uL (ref 4.14–5.80)
RDW: 13.8 % (ref 11.6–15.4)
WBC: 10.1 10*3/uL (ref 3.4–10.8)

## 2020-03-13 LAB — LIPID PANEL
Chol/HDL Ratio: 6.6 ratio — ABNORMAL HIGH (ref 0.0–5.0)
Cholesterol, Total: 269 mg/dL — ABNORMAL HIGH (ref 100–199)
HDL: 41 mg/dL (ref >39)
LDL Chol Calc (NIH): 155 mg/dL — ABNORMAL HIGH (ref 0–99)
Triglycerides: 384 mg/dL — ABNORMAL HIGH (ref 0–149)
VLDL Cholesterol Cal: 73 mg/dL — ABNORMAL HIGH (ref 5–40)

## 2020-05-20 ENCOUNTER — Other Ambulatory Visit (INDEPENDENT_AMBULATORY_CARE_PROVIDER_SITE_OTHER): Payer: 59

## 2020-05-20 ENCOUNTER — Other Ambulatory Visit: Payer: Self-pay

## 2020-05-20 DIAGNOSIS — Z23 Encounter for immunization: Secondary | ICD-10-CM

## 2020-12-10 ENCOUNTER — Encounter: Payer: Self-pay | Admitting: Family Medicine

## 2020-12-10 ENCOUNTER — Ambulatory Visit: Payer: 59 | Admitting: Family Medicine

## 2020-12-10 ENCOUNTER — Other Ambulatory Visit: Payer: Self-pay

## 2020-12-10 VITALS — BP 130/84 | HR 97 | Temp 98.5°F | Wt 220.2 lb

## 2020-12-10 DIAGNOSIS — M545 Low back pain, unspecified: Secondary | ICD-10-CM

## 2020-12-10 DIAGNOSIS — G8929 Other chronic pain: Secondary | ICD-10-CM

## 2020-12-10 NOTE — Progress Notes (Signed)
   Subjective:    Patient ID: Micheal Hodge, male    DOB: December 24, 1955, 65 y.o.   MRN: [id number]  HPI He is here for consult concerning low back pain.  He has a history dating back to in his 33s of intermittent low back pain.  He has seen chiropractic in the past for this but has never had a full formal evaluation.  He states that since Christmas he is noting some left-sided low back pain that he notes especially when he goes from sitting to standing and recently has noted some radiation down to his calf.   Review of Systems     Objective:   Physical Exam Alert and in no distress.  No palpable tenderness over the lumbar spine, SI joint, sciatic notch.  Normal lumbar curve and motion.  Negative straight leg raising.  DTRs difficult to assess due to a previous history of neuropathy.       Assessment & Plan:  Chronic low back pain, unspecified back pain laterality, unspecified whether sciatica present I explained that the best way to handle this is proper back care with heat, stretching, 2 Aleve twice per day for the next 2 to 4 weeks.  Information given concerning proper back care.  If he continues have difficulty, x-rays will be done and possible referral for more formal physical therapy.  He was comfortable with that.

## 2020-12-10 NOTE — Patient Instructions (Signed)
Acute Back Pain, Adult Acute back pain is sudden and usually short-lived. It is often caused by an injury to the muscles and tissues in the back. The injury may result from:  A muscle or ligament getting overstretched or torn (strained). Ligaments are tissues that connect bones to each other. Lifting something improperly can cause a back strain.  Wear and tear (degeneration) of the spinal disks. Spinal disks are circular tissue that provide cushioning between the bones of the spine (vertebrae).  Twisting motions, such as while playing sports or doing yard work.  A hit to the back.  Arthritis. You may have a physical exam, lab tests, and imaging tests to find the cause of your pain. Acute back pain usually goes away with rest and home care. Follow these instructions at home: Managing pain, stiffness, and swelling  Treatment may include medicines for pain and inflammation that are taken by mouth or applied to the skin, prescription pain medicine, or muscle relaxants. Take over-the-counter and prescription medicines only as told by your health care provider.  Your health care provider may recommend applying ice during the first 24-48 hours after your pain starts. To do this: ? Put ice in a plastic bag. ? Place a towel between your skin and the bag. ? Leave the ice on for 20 minutes, 2-3 times a day.  If directed, apply heat to the affected area as often as told by your health care provider. Use the heat source that your health care provider recommends, such as a moist heat pack or a heating pad. ? Place a towel between your skin and the heat source. ? Leave the heat on for 20-30 minutes. ? Remove the heat if your skin turns bright red. This is especially important if you are unable to feel pain, heat, or cold. You have a greater risk of getting burned. Activity  Do not stay in bed. Staying in bed for more than 1-2 days can delay your recovery.  Sit up and stand up straight. Avoid leaning  forward when you sit or hunching over when you stand. ? If you work at a desk, sit close to it so you do not need to lean over. Keep your chin tucked in. Keep your neck drawn back, and keep your elbows bent at a 90-degree angle (right angle). ? Sit high and close to the steering wheel when you drive. Add lower back (lumbar) support to your car seat, if needed.  Take short walks on even surfaces as soon as you are able. Try to increase the length of time you walk each day.  Do not sit, drive, or stand in one place for more than 30 minutes at a time. Sitting or standing for long periods of time can put stress on your back.  Do not drive or use heavy machinery while taking prescription pain medicine.  Use proper lifting techniques. When you bend and lift, use positions that put less stress on your back: ? Bend your knees. ? Keep the load close to your body. ? Avoid twisting.  Exercise regularly as told by your health care provider. Exercising helps your back heal faster and helps prevent back injuries by keeping muscles strong and flexible.  Work with a physical therapist to make a safe exercise program, as recommended by your health care provider. Do any exercises as told by your physical therapist.   Lifestyle  Maintain a healthy weight. Extra weight puts stress on your back and makes it difficult to have   good posture.  Avoid activities or situations that make you feel anxious or stressed. Stress and anxiety increase muscle tension and can make back pain worse. Learn ways to manage anxiety and stress, such as through exercise. General instructions  Sleep on a firm mattress in a comfortable position. Try lying on your side with your knees slightly bent. If you lie on your back, put a pillow under your knees.  Follow your treatment plan as told by your health care provider. This may include: ? Cognitive or behavioral therapy. ? Acupuncture or massage therapy. ? Meditation or yoga. Contact  a health care provider if:  You have pain that is not relieved with rest or medicine.  You have increasing pain going down into your legs or buttocks.  Your pain does not improve after 2 weeks.  You have pain at night.  You lose weight without trying.  You have a fever or chills. Get help right away if:  You develop new bowel or bladder control problems.  You have unusual weakness or numbness in your arms or legs.  You develop nausea or vomiting.  You develop abdominal pain.  You feel faint. Summary  Acute back pain is sudden and usually short-lived.  Use proper lifting techniques. When you bend and lift, use positions that put less stress on your back.  Take over-the-counter and prescription medicines and apply heat or ice as directed by your health care provider. This information is not intended to replace advice given to you by your health care provider. Make sure you discuss any questions you have with your health care provider. Document Revised: 07/12/2020 Document Reviewed: 07/12/2020 Elsevier Patient Education  2021 [employer]  

## 2020-12-30 ENCOUNTER — Telehealth: Payer: Self-pay | Admitting: Family Medicine

## 2020-12-30 ENCOUNTER — Other Ambulatory Visit: Payer: Self-pay | Admitting: Family Medicine

## 2020-12-30 DIAGNOSIS — G8929 Other chronic pain: Secondary | ICD-10-CM

## 2020-12-30 DIAGNOSIS — M545 Low back pain, unspecified: Secondary | ICD-10-CM

## 2020-12-30 NOTE — Telephone Encounter (Signed)
Pt called and states that at his last appt he was advised that if pain continued he would need a MRI. Pt states that he is now in almost constant pain. Please advise pt [phone].

## 2020-12-30 NOTE — Telephone Encounter (Signed)
My note stated that I was going to get x-rays and then sent for physical therapy.  I will put an order in for LS spine.  Let him know that that is the next step rather than going right to an MRI.

## 2020-12-31 NOTE — Telephone Encounter (Signed)
Lvm for pt to call back. KH 

## 2021-01-02 NOTE — Telephone Encounter (Signed)
Pt called back and states cancel whatever appt that was made for him he is going to see a back dr states he never got a call back, states he got the call Monday but was driving and could not answer and he called back and never heard back about what the next steps was going to be  Pt can be reached at [phone]

## 2021-01-02 NOTE — Telephone Encounter (Signed)
LVM for pt to call back and advise if he got the pt done after a x-ray. And also wanted to know if Mri is ok for the next step. [place]

## 2021-03-24 ENCOUNTER — Other Ambulatory Visit: Payer: Self-pay | Admitting: Family Medicine

## 2021-03-24 DIAGNOSIS — I10 Essential (primary) hypertension: Secondary | ICD-10-CM

## 2021-04-23 ENCOUNTER — Encounter: Payer: Self-pay | Admitting: Family Medicine

## 2021-04-23 ENCOUNTER — Other Ambulatory Visit: Payer: Self-pay

## 2021-04-23 ENCOUNTER — Ambulatory Visit: Payer: 59 | Admitting: Family Medicine

## 2021-04-23 VITALS — BP 130/74 | HR 79 | Temp 97.6°F | Ht 72.25 in | Wt 220.4 lb

## 2021-04-23 DIAGNOSIS — Z9889 Other specified postprocedural states: Secondary | ICD-10-CM

## 2021-04-23 DIAGNOSIS — I10 Essential (primary) hypertension: Secondary | ICD-10-CM

## 2021-04-23 DIAGNOSIS — Z1211 Encounter for screening for malignant neoplasm of colon: Secondary | ICD-10-CM

## 2021-04-23 MED ORDER — EDARBI 40 MG PO TABS: 1.0000 | ORAL_TABLET | Freq: Every day | ORAL | 1 refills | Status: DC

## 2021-04-23 MED ORDER — EDARBI 40 MG PO TABS
1.0000 | ORAL_TABLET | Freq: Every day | ORAL | 3 refills | Status: DC
Start: 2021-04-23 — End: 2021-04-23

## 2021-04-23 NOTE — Progress Notes (Signed)
   Subjective:    Patient ID: Micheal Hodge, male    DOB: [birth year], 65 y.o.   MRN: [id number]  HPI He is here today for a blood pressure recheck.  Recently he did have surgery on his back for an L5-S1 lesion and seems to be recovering from this quite nicely.  He continues on his blood pressure medications.  His immunizations were reviewed.  He also needs follow-up on colon cancer screening.   Review of Systems     Objective:   Physical Exam Alert and in no distress.  Blood pressure is recorded.       Assessment & Plan:  Essential hypertension - Plan: Azilsartan Medoxomil (EDARBI) 40 MG TABS  Screening for colon cancer - Plan: Cologuard  H/O lumbar discectomy

## 2021-07-22 ENCOUNTER — Ambulatory Visit: Payer: 59 | Admitting: Family Medicine

## 2021-07-22 ENCOUNTER — Other Ambulatory Visit: Payer: Self-pay

## 2021-07-22 VITALS — BP 140/86 | HR 65 | Temp 98.8°F | Wt 219.6 lb

## 2021-07-22 DIAGNOSIS — B070 Plantar wart: Secondary | ICD-10-CM | POA: Diagnosis not present

## 2021-07-22 DIAGNOSIS — G6289 Other specified polyneuropathies: Secondary | ICD-10-CM | POA: Diagnosis not present

## 2021-07-22 NOTE — Progress Notes (Signed)
   Subjective:    Patient ID: Micheal Hodge, male    DOB: 03-Jul-1956, 65 y.o.   MRN: [id number]  HPI He has 2 lesions on the plantar surface of the right great toe that he has noticed for the last month or so.   Review of Systems     Objective:   Physical Exam Exam of the right great toe shows a much smaller distal plantar wart and a much larger 1 with central clearing.       Assessment & Plan:  Plantar wart, left foot - Plan: Ambulatory referral to Dermatology  Other polyneuropathy It would be difficult for him to treat this conservatively at home due to the area and difficulty to reach as well as his neuropathy and I will therefore help refer him to dermatology for definitive care.

## 2021-07-22 NOTE — Addendum Note (Signed)
Addended by: Elyse Jarvis on: 07/22/2021 03:22 PM   Modules accepted: Orders

## 2021-07-31 ENCOUNTER — Encounter: Payer: Self-pay | Admitting: Gastroenterology

## 2021-08-12 ENCOUNTER — Ambulatory Visit: Payer: 59 | Admitting: Podiatry

## 2021-10-14 ENCOUNTER — Emergency Department
Admission: EM | Admit: 2021-10-14 | Discharge: 2021-10-14 | Disposition: A | Discharge status: Home / Self Care | Payer: 59 | Attending: Emergency Medicine | Admitting: Emergency Medicine

## 2021-10-14 ENCOUNTER — Other Ambulatory Visit: Payer: Self-pay

## 2021-10-14 DIAGNOSIS — Z87891 Personal history of nicotine dependence: Secondary | ICD-10-CM | POA: Diagnosis not present

## 2021-10-14 DIAGNOSIS — I10 Essential (primary) hypertension: Secondary | ICD-10-CM | POA: Insufficient documentation

## 2021-10-14 DIAGNOSIS — K625 Hemorrhage of anus and rectum: Secondary | ICD-10-CM | POA: Diagnosis present

## 2021-10-14 DIAGNOSIS — Z79899 Other long term (current) drug therapy: Secondary | ICD-10-CM | POA: Insufficient documentation

## 2021-10-14 DIAGNOSIS — R1032 Left lower quadrant pain: Secondary | ICD-10-CM | POA: Diagnosis not present

## 2021-10-14 DIAGNOSIS — K649 Unspecified hemorrhoids: Secondary | ICD-10-CM | POA: Diagnosis not present

## 2021-10-14 DIAGNOSIS — Z8616 Personal history of COVID-19: Secondary | ICD-10-CM | POA: Insufficient documentation

## 2021-10-14 LAB — CBC
HCT: 49.4 % (ref 39.0–52.0)
Hemoglobin: 16.6 g/dL (ref 13.0–17.0)
MCH: 30.2 pg (ref 26.0–34.0)
MCHC: 33.6 g/dL (ref 30.0–36.0)
MCV: 89.8 fL (ref 80.0–100.0)
Platelets: 376 10*3/uL (ref 150–400)
RBC: 5.5 MIL/uL (ref 4.22–5.81)
RDW: 14.3 % (ref 11.5–15.5)
WBC: 11.3 10*3/uL — ABNORMAL HIGH (ref 4.0–10.5)
nRBC: 0 % (ref 0.0–0.2)

## 2021-10-14 LAB — TYPE AND SCREEN
ABO/RH(D): A POS
Antibody Screen: NEGATIVE

## 2021-10-14 LAB — COMPREHENSIVE METABOLIC PANEL
ALT: 32 U/L (ref 0–44)
AST: 26 U/L (ref 15–41)
Albumin: 3.8 g/dL (ref 3.5–5.0)
Alkaline Phosphatase: 92 U/L (ref 38–126)
Anion gap: 7 (ref 5–15)
BUN: 20 mg/dL (ref 8–23)
CO2: 26 mmol/L (ref 22–32)
Calcium: 8.9 mg/dL (ref 8.9–10.3)
Chloride: 98 mmol/L (ref 98–111)
Creatinine, Ser: 0.79 mg/dL (ref 0.61–1.24)
GFR, Estimated: >60 mL/min (ref >60)
Glucose, Bld: 169 mg/dL — ABNORMAL HIGH (ref 70–99)
Potassium: 4.5 mmol/L (ref 3.5–5.1)
Sodium: 131 mmol/L — ABNORMAL LOW (ref 135–145)
Total Bilirubin: 0.6 mg/dL (ref 0.3–1.2)
Total Protein: 7.1 g/dL (ref 6.5–8.1)

## 2021-10-14 MED ORDER — HYDROCORTISONE ACETATE 25 MG RE SUPP: 25.0000 mg | Freq: Two times a day (BID) | RECTAL | 1 refills | Status: DC

## 2021-10-14 MED ORDER — DOCUSATE SODIUM 100 MG PO CAPS: 100.0000 mg | ORAL_CAPSULE | Freq: Two times a day (BID) | ORAL | 0 refills | Status: AC

## 2021-10-14 MED ORDER — DOCUSATE SODIUM 100 MG PO CAPS: 100.0000 mg | ORAL_CAPSULE | Freq: Two times a day (BID) | ORAL | 0 refills | Status: DC

## 2021-10-14 NOTE — ED Notes (Signed)
Rectal exam and occult blood test performed by MD

## 2021-10-14 NOTE — ED Provider Notes (Signed)
[facility] Emergency Department Provider Note   ____________________________________________   Event Date/Time   First MD Initiated Contact with Patient 10/14/21 1732     (approximate)  I have reviewed the triage vital signs and the nursing notes.   HISTORY  Chief Complaint Rectal Bleeding    HPI Micheal Hodge is a 65 y.o. male with past medical history of hypertension, hyperlipidemia, and GERD who presents to the ED complaining of rectal bleeding.  Patient reports that over about the past 8 days he has been dealing with persistent rectal bleeding.  He states this has been a problem for him in the past, and he has dealt with intermittent light rectal bleeding for multiple years, reports history of hemorrhoids.  He states this bleeding has increased in frequency and severity since he was diagnosed with COVID on December 5.  He reports pain in his rectal area along with mild discomfort in the left lower quadrant of his abdomen.  He has not noticed any bloody stools and denies any dark tarry stool.  He has not had any fever, nausea, or vomiting.  He reports a history of bowel obstruction in 2012, however current symptoms feel very different.  He does not take any blood thinners.        Past Medical History:  Diagnosis Date   Allergy    Arthritis    Blood glucose elevated 09/07/2018   Bowel obstruction (HCC)    Colon polyps    Diverticulitis    Diverticulosis    ED (erectile dysfunction)    Elevated cholesterol    Esophageal stricture    GERD (gastroesophageal reflux disease)    Headache    High blood pressure    Hyperlipidemia    Hypertension    Inflammatory bowel disease    Migraine headache    Pneumonia    Rosacea     Patient Active Problem List   Diagnosis Date Noted   H/O lumbar discectomy 04/23/2021   Blood glucose elevated 09/07/2018   Ganglion cyst of finger of left hand 08/02/2017   Chronic low back pain 07/22/2015   History of  migraine headaches 07/22/2015   Hyperlipidemia LDL goal <130 07/22/2015   Hypertension 01/11/2014   History of esophageal stricture 12/05/2012   Peripheral neuropathy 12/05/2012   Diverticulosis of colon-tics scattered throughout with preponderance in sigmoid 02/03/2012   Internal hemorrhoids 06/15/2011   GERD 07/31/2008    Past Surgical History:  Procedure Laterality Date   COLONOSCOPY  2009   NASAL SEPTUM SURGERY     UPPER GASTROINTESTINAL ENDOSCOPY  2009    Prior to Admission medications   Medication Sig Start Date End Date Taking? Authorizing Provider  docusate sodium (COLACE) 100 MG capsule Take 1 capsule (100 mg total) by mouth 2 (two) times daily. 10/14/21 11/13/21 Yes Blake Divine, MD  hydrocortisone (ANUSOL-HC) 25 MG suppository Place 1 suppository (25 mg total) rectally every 12 (twelve) hours. 10/14/21 10/14/22 Yes Blake Divine, MD  Azilsartan Medoxomil (EDARBI) 40 MG TABS Take 1 tablet by mouth daily. 04/23/21   Denita Lung, MD  FIBER COMPLETE PO Take 1 tablet by mouth 2 (two) times daily.    [provider]  hydrochlorothiazide (MICROZIDE) 12.5 MG capsule Take 1 capsule (12.5 mg total) by mouth daily. Patient not taking: No sig reported 02/20/19   Denita Lung, MD  Multiple Vitamin (MULTI-VITAMIN) tablet Take by mouth.    [provider]    Allergies Poison ivy extract  Family  History  Problem Relation Age of Onset   Diabetes Mother    Hyperlipidemia Mother    Heart disease Mother        stents   Cancer Mother        Melanoma   Hypertension Father    Heart disease Father     Social History Social History   Tobacco Use   Smoking status: Former    Years: 15.00    Types: Cigarettes    Quit date: 11/02/1986    Years since quitting: 34.9   Smokeless tobacco: Never  Vaping Use   Vaping Use: Never used  Substance Use Topics   Alcohol use: Yes    Alcohol/week: 20.0 standard drinks    Types: 20 Cans of beer per week   Drug use:  No    Review of Systems  Constitutional: No fever/chills Eyes: No visual changes. ENT: No sore throat. Cardiovascular: Denies chest pain. Respiratory: Denies shortness of breath. Gastrointestinal: Positive for rectal pain and abdominal pain.  No nausea, no vomiting.  No diarrhea.  No constipation.  Positive for rectal bleeding. Genitourinary: Negative for dysuria. Musculoskeletal: Negative for back pain. Skin: Negative for rash. Neurological: Negative for headaches, focal weakness or numbness.  ____________________________________________   PHYSICAL EXAM:  VITAL SIGNS: ED Triage Vitals [10/14/21 1532]  Enc Vitals Group     BP 134/78     Pulse Rate (!) 104     Resp 20     Temp 98.7 F (37.1 C)     Temp Source Oral     SpO2 96 %     Weight 208 lb (94.3 kg)     Height 6\' 1"  (1.854 m)     Head Circumference      Peak Flow      Pain Score 3     Pain Loc      Pain Edu?      Excl. in [place]?     Constitutional: Alert and oriented. Eyes: Conjunctivae are normal. Head: Atraumatic. Nose: No congestion/rhinnorhea. Mouth/Throat: Mucous membranes are moist. Neck: Normal ROM Cardiovascular: Normal rate, regular rhythm. Grossly normal heart sounds.  2+ radial pulses bilaterally. Respiratory: Normal respiratory effort.  No retractions. Lungs CTAB. Gastrointestinal: Soft and nontender. No distention.  Rectal exam with multiple hemorrhoids, small area of prior bleeding noted but no active bleeding at this time.  Stool is light brown and guaiac negative. Genitourinary: deferred Musculoskeletal: No lower extremity tenderness nor edema. Neurologic:  Normal speech and language. No gross focal neurologic deficits are appreciated. Skin:  Skin is warm, dry and intact. No rash noted. Psychiatric: Mood and affect are normal. Speech and behavior are normal.  ____________________________________________   LABS (all labs ordered are listed, but only abnormal results are displayed)  Labs  Reviewed  COMPREHENSIVE METABOLIC PANEL - Abnormal; Notable for the following components:      Result Value   Sodium 131 (*)    Glucose, Bld 169 (*)    All other components within normal limits  CBC - Abnormal; Notable for the following components:   WBC 11.3 (*)    All other components within normal limits  POC OCCULT BLOOD, ED  TYPE AND SCREEN    PROCEDURES  Procedure(s) performed (including Critical Care):  Procedures   ____________________________________________   INITIAL IMPRESSION / ASSESSMENT AND PLAN / ED COURSE      65 year old male with past medical history of hypertension, hyperlipidemia, and GERD who presents to the ED complaining of about 8 days of  persistent rectal bleeding with some rectal pain and left lower quadrant abdominal pain.  Patient's abdominal exam is benign with no tenderness or distention, no reason to suspect diverticulitis or recurrent SBO at this time.  He does have multiple hemorrhoids on exam with signs of recent bleeding but no active bleeding noted.  Labs are reassuring with stable H&H and I doubt significant GI bleed at this time.  He is appropriate for outpatient management of hemorrhoids, will be prescribed Anusol and was counseled on sitz bath's as well as stool softeners and fiber supplement.  He was provided with referral to general surgery and was counseled to return to the ED for new worsening symptoms, patient agrees with plan.      ____________________________________________   FINAL CLINICAL IMPRESSION(S) / ED DIAGNOSES  Final diagnoses:  Hemorrhoids, unspecified hemorrhoid type  Rectal bleeding     ED Discharge Orders          Ordered    hydrocortisone (ANUSOL-HC) 25 MG suppository  Every 12 hours        10/14/21 1755    docusate sodium (COLACE) 100 MG capsule  2 times daily        10/14/21 1755             Note:  This document was prepared using Dragon voice recognition software and may include unintentional  dictation errors.    Blake Divine, MD 10/14/21 [phone]

## 2021-10-14 NOTE — ED Triage Notes (Signed)
Pt to ED for rectal bleeding with BM since being dx with covid on 12/5. Reports bright red blood multiple times a day.  Lower abd cramping Hx bowel blockage in 2012.

## 2021-10-20 ENCOUNTER — Encounter: Payer: Self-pay | Admitting: Family Medicine

## 2021-10-20 ENCOUNTER — Other Ambulatory Visit: Payer: Self-pay

## 2021-10-20 ENCOUNTER — Ambulatory Visit: Payer: 59 | Admitting: Family Medicine

## 2021-10-20 VITALS — BP 102/66 | HR 85 | Temp 98.1°F | Wt 215.0 lb

## 2021-10-20 DIAGNOSIS — Z1211 Encounter for screening for malignant neoplasm of colon: Secondary | ICD-10-CM | POA: Diagnosis not present

## 2021-10-20 DIAGNOSIS — Z8616 Personal history of COVID-19: Secondary | ICD-10-CM | POA: Diagnosis not present

## 2021-10-20 DIAGNOSIS — K649 Unspecified hemorrhoids: Secondary | ICD-10-CM

## 2021-10-20 DIAGNOSIS — N529 Male erectile dysfunction, unspecified: Secondary | ICD-10-CM | POA: Diagnosis not present

## 2021-10-20 MED ORDER — TADALAFIL 20 MG PO TABS: 20.0000 mg | ORAL_TABLET | Freq: Every day | ORAL | 0 refills | Status: DC | PRN

## 2021-10-20 NOTE — Patient Instructions (Signed)
Make sure you get fluids, Bulk in your diet and exercise and also listen to your body. You  can also use TUCKS

## 2021-10-20 NOTE — Progress Notes (Signed)
° °  Subjective:    Patient ID: Micheal Hodge, male    DOB: [birth year], 65 y.o.   MRN: [id number]  HPI He is here for consult concerning recent emergency room visit.  He was sent there by the [facility] clinic for further evaluation of rectal bleeding.  The emergency room record was reviewed.  He does have evidence of external hemorrhoids.  Review of the record also indicates need for follow-up colon cancer screening.  He did have a Cologuard approximately 3 years ago.  He also has a recent history of COVID but seems to have recovered nicely from that.  He is starting to date someone and he has had some erectile dysfunction issues.   Review of Systems     Objective:   Physical Exam Alert and in no distress otherwise not examined       Assessment & Plan:  Hemorrhoids, unspecified hemorrhoid type  Erectile dysfunction, unspecified erectile dysfunction type - Plan: tadalafil (CIALIS) 20 MG tablet  Screening for colon cancer - Plan: Cologuard  History of COVID-19 I discussed the treatment of hemorrhoids with fluids, Balkan diet, exercise, sitz bathes and Tucks.  Also discussed the possibility of surgery for this. Then discussed the use of Cialis for his ED.  He will keep me informed.

## 2021-11-08 LAB — COLOGUARD: COLOGUARD: NEGATIVE

## 2021-11-20 ENCOUNTER — Encounter: Payer: 59 | Admitting: Family Medicine

## 2022-01-14 ENCOUNTER — Telehealth: Payer: Self-pay

## 2022-01-14 ENCOUNTER — Other Ambulatory Visit: Payer: Self-pay

## 2022-01-14 DIAGNOSIS — I10 Essential (primary) hypertension: Secondary | ICD-10-CM

## 2022-01-14 NOTE — Telephone Encounter (Signed)
Sent to laura to help with the finding a pharmacy due to the cost.  ?

## 2022-01-14 NOTE — Telephone Encounter (Signed)
Pt is requesting refill on  Edarbi. He does not want it sent to the previous pharmacy because the price has increased. He said Dr Redmond School found the cheaper pharmacy last time and wanted to see if he could do the same this time ?

## 2022-01-14 NOTE — Telephone Encounter (Signed)
Please advise if pt can get this cheaper . Thanks [place] ?

## 2022-01-21 ENCOUNTER — Telehealth: Payer: Self-pay

## 2022-01-21 ENCOUNTER — Other Ambulatory Visit: Payer: Self-pay

## 2022-01-21 DIAGNOSIS — I10 Essential (primary) hypertension: Secondary | ICD-10-CM

## 2022-01-21 MED ORDER — EDARBI 40 MG PO TABS: 1.0000 | ORAL_TABLET | Freq: Every day | ORAL | 1 refills | Status: DC

## 2022-01-21 NOTE — Telephone Encounter (Signed)
Done KH 

## 2022-01-21 NOTE — Telephone Encounter (Signed)
Called pt and states he was getting Edarbi for like $10-15 a month but price has went up with mail order.  He would like it sent back to [facility] to see what it would cost there and see if insurance will cover it there.  Please send refill to [facility]

## 2022-01-23 NOTE — Telephone Encounter (Signed)
Pt filled at [facility] with ins and discount card for $15

## 2022-01-23 NOTE — Telephone Encounter (Signed)
[place] went thru [employer] and coupon card for $15, pt picked up

## 2022-01-23 NOTE — Telephone Encounter (Signed)
done

## 2022-03-12 ENCOUNTER — Encounter: Payer: Self-pay | Admitting: Family Medicine

## 2022-03-12 ENCOUNTER — Ambulatory Visit (INDEPENDENT_AMBULATORY_CARE_PROVIDER_SITE_OTHER): Payer: 59 | Admitting: Family Medicine

## 2022-03-12 VITALS — BP 124/76 | HR 88 | Temp 98.8°F | Ht 71.0 in | Wt 211.6 lb

## 2022-03-12 DIAGNOSIS — I10 Essential (primary) hypertension: Secondary | ICD-10-CM | POA: Diagnosis not present

## 2022-03-12 DIAGNOSIS — N529 Male erectile dysfunction, unspecified: Secondary | ICD-10-CM | POA: Diagnosis not present

## 2022-03-12 DIAGNOSIS — E785 Hyperlipidemia, unspecified: Secondary | ICD-10-CM | POA: Diagnosis not present

## 2022-03-12 DIAGNOSIS — Z8719 Personal history of other diseases of the digestive system: Secondary | ICD-10-CM

## 2022-03-12 DIAGNOSIS — G8929 Other chronic pain: Secondary | ICD-10-CM

## 2022-03-12 DIAGNOSIS — R739 Hyperglycemia, unspecified: Secondary | ICD-10-CM

## 2022-03-12 DIAGNOSIS — K649 Unspecified hemorrhoids: Secondary | ICD-10-CM

## 2022-03-12 DIAGNOSIS — Z8669 Personal history of other diseases of the nervous system and sense organs: Secondary | ICD-10-CM

## 2022-03-12 DIAGNOSIS — Z8616 Personal history of COVID-19: Secondary | ICD-10-CM

## 2022-03-12 DIAGNOSIS — J309 Allergic rhinitis, unspecified: Secondary | ICD-10-CM

## 2022-03-12 DIAGNOSIS — Z9889 Other specified postprocedural states: Secondary | ICD-10-CM

## 2022-03-12 DIAGNOSIS — K219 Gastro-esophageal reflux disease without esophagitis: Secondary | ICD-10-CM | POA: Diagnosis not present

## 2022-03-12 DIAGNOSIS — R0609 Other forms of dyspnea: Secondary | ICD-10-CM

## 2022-03-12 DIAGNOSIS — M545 Low back pain, unspecified: Secondary | ICD-10-CM

## 2022-03-12 DIAGNOSIS — Z0000 Encounter for general adult medical examination without abnormal findings: Secondary | ICD-10-CM | POA: Diagnosis not present

## 2022-03-12 MED ORDER — EDARBI 40 MG PO TABS: 1.0000 | ORAL_TABLET | Freq: Every day | ORAL | 11 refills | Status: DC

## 2022-03-12 MED ORDER — TADALAFIL 20 MG PO TABS: 20.0000 mg | ORAL_TABLET | Freq: Every day | ORAL | 5 refills | Status: DC | PRN

## 2022-03-12 NOTE — Progress Notes (Addendum)
Lipitor ? ?Complete physical exam ? ?Patient: Micheal Hodge   DOB: 07-13-1956   66 y.o. Male  MRN: [id number] ? ?Subjective:  ?  ?Chief Complaint  ?Patient presents with  ? Annual Exam  ?  Labs drawn this am  ? ? ?Micheal Hodge is a 66 y.o. male who presents today for a complete physical exam. He reports consuming a general diet.  Walking in the evening and yard work is his work out routine.  He generally feels well. He reports sleeping poorly due to waking up a lot. He states that he has a 16-monthhistory that he describes as shortness of breath with exertion.  Especially when he goes up steps.  He thinks it is getting slightly worse.  He relates this to starting after he had COVID in December of last year.  No associated chest pain, diaphoresis, fatigue or irregular heart rate.  No PND.  He also notes that he has a previous history of esophageal stricture with dilatation.  He was placed on a PPI but stopped it.  He is also having some reflux symptoms.  He continues on his blood pressure medicine without difficulty.  He occasionally does have difficulty with hemorrhoids and does use a hemorrhoidal prep for that.  He does have chronic back pain but is not on any medication for that.  He has not had a migraine in quite some time.  Does have a history of hyperlipidemia.  Also has a previous history of elevated blood sugar.  Continues to do well on Cialis for his ED.  His allergies seem to be under good control with the OTC medications he is presently taking. ? ? ?Most recent fall risk assessment: ? ?  04/23/2021  ?  9:33 AM  ?Fall Risk   ?Falls in the past year? 0  ?Number falls in past yr: 0  ?Injury with Fall? 0  ?Follow up Falls evaluation completed  ? ?  ?Most recent depression screenings: ? ?  03/12/2022  ?  2:36 PM 04/23/2021  ?  9:33 AM  ?PHQ 2/9 Scores  ?PHQ - 2 Score 0 0  ? ? ? ? ? ? ?Patient Care Team: ?LDenita Lung MD as PCP - General (Family Medicine)  ? ?Outpatient Medications Prior to Visit  ?Medication  Sig  ? Cetirizine HCl (ALL DAY ALLERGY PO) Take by mouth.  ? FIBER COMPLETE PO Take 1 tablet by mouth 2 (two) times daily.  ? Multiple Vitamin (MULTI-VITAMIN) tablet Take by mouth.  ? [DISCONTINUED] Azilsartan Medoxomil (EDARBI) 40 MG TABS Take 1 tablet by mouth daily.  ? [DISCONTINUED] tadalafil (CIALIS) 20 MG tablet Take 1 tablet (20 mg total) by mouth daily as needed for erectile dysfunction.  ? [DISCONTINUED] hydrochlorothiazide (MICROZIDE) 12.5 MG capsule Take 1 capsule (12.5 mg total) by mouth daily. (Patient not taking: Reported on 04/23/2021)  ? [DISCONTINUED] hydrocortisone (ANUSOL-HC) 25 MG suppository Place 1 suppository (25 mg total) rectally every 12 (twelve) hours. (Patient not taking: Reported on 10/20/2021)  ? ?No facility-administered medications prior to visit.  ? ? ?Review of Systems  ?All other systems reviewed and are negative. ? ? ? ? ?   ?Objective:  ? ?  ?BP 124/76   Pulse 88   Temp 98.8 ?F (37.1 ?C)   Ht '5\' 11"'$  (1.803 m)   Wt 211 lb 9.6 oz (96 kg)   SpO2 95%   BMI 29.51 kg/m?  ? ? ?Physical Exam  ?Alert and in no distress. Tympanic  membranes and canals are normal. Pharyngeal area is normal. Neck is supple without adenopathy or thyromegaly. Cardiac exam shows a regular sinus rhythm without murmurs or gallops. Lungs are clear to auscultation.  Abdominal exam shows no masses or tenderness with normal bowel sounds.  EKG read by me shows a rate of 75 with other parameters being normal.  ? ?Last CBC ?Lab Results  ?Component Value Date  ? WBC 11.3 (H) 10/14/2021  ? HGB 16.6 10/14/2021  ? HCT 49.4 10/14/2021  ? MCV 89.8 10/14/2021  ? MCH 30.2 10/14/2021  ? RDW 14.3 10/14/2021  ? PLT 376 10/14/2021  ? ?Last metabolic panel ?Lab Results  ?Component Value Date  ? GLUCOSE 169 (H) 10/14/2021  ? NA 131 (L) 10/14/2021  ? K 4.5 10/14/2021  ? CL 98 10/14/2021  ? CO2 26 10/14/2021  ? BUN 20 10/14/2021  ? CREATININE 0.79 10/14/2021  ? GFRNONAA >60 10/14/2021  ? CALCIUM 8.9 10/14/2021  ? PROT 7.1  10/14/2021  ? ALBUMIN 3.8 10/14/2021  ? LABGLOB 3.2 03/12/2020  ? AGRATIO 1.4 03/12/2020  ? BILITOT 0.6 10/14/2021  ? ALKPHOS 92 10/14/2021  ? AST 26 10/14/2021  ? ALT 32 10/14/2021  ? ANIONGAP 7 10/14/2021  ? ?Last lipids ?Lab Results  ?Component Value Date  ? CHOL 269 (H) 03/12/2020  ? HDL 41 03/12/2020  ? LDLCALC 155 (H) 03/12/2020  ? TRIG 384 (H) 03/12/2020  ? CHOLHDL 6.6 (H) 03/12/2020  ? ?  ?   ?Assessment & Plan:  ?  ?Routine Health Maintenance and Physical Exam ? ?Immunization History  ?Administered Date(s) Administered  ? Influenza Split 08/02/2012  ? Influenza-Unspecified 08/20/2015, 08/31/2016, 09/09/2017, 08/03/2018, 08/29/2020, 07/16/2021  ? PFIZER(Purple Top)SARS-COV-2 Vaccination 01/10/2020, 02/07/2020  ? Tdap 12/05/2012  ? Zoster Recombinat (Shingrix) 03/12/2020, 05/20/2020  ? ? ?Health Maintenance  ?Topic Date Due  ? COVID-19 Vaccine (3 - Booster for Pfizer series) 04/03/2020  ? HIV Screening  04/23/2022 (Originally 01/07/1971)  ? INFLUENZA VACCINE  06/02/2022  ? TETANUS/TDAP  12/05/2022  ? Fecal DNA (Cologuard)  10/31/2024  ? Hepatitis C Screening  Completed  ? Zoster Vaccines- Shingrix  Completed  ? HPV VACCINES  Aged Out  ? COLONOSCOPY (Pts 45-82yr Insurance coverage will need to be confirmed)  Discontinued  ? ? ?Discussed health benefits of physical activity, and encouraged him to engage in regular exercise appropriate for his age and condition. ? ?Problem List Items Addressed This Visit   ? ? History of esophageal stricture (Chronic)  ? Relevant Orders  ? Ambulatory referral to Gastroenterology  ? Allergic rhinitis  ? Blood glucose elevated  ? Relevant Orders  ? Comprehensive metabolic panel  ? Chronic low back pain  ? Erectile dysfunction  ? Relevant Medications  ? tadalafil (CIALIS) 20 MG tablet  ? GERD  ? Relevant Orders  ? Ambulatory referral to Gastroenterology  ? H/O lumbar discectomy  ? Hemorrhoids  ? Relevant Medications  ? Azilsartan Medoxomil (EDARBI) 40 MG TABS  ? tadalafil (CIALIS)  20 MG tablet  ? History of COVID-19  ? History of migraine headaches  ? Hyperlipidemia LDL goal <130  ? Relevant Medications  ? Azilsartan Medoxomil (EDARBI) 40 MG TABS  ? tadalafil (CIALIS) 20 MG tablet  ? Other Relevant Orders  ? Lipid panel  ? ?Other Visit Diagnoses   ? ? Routine general medical examination at a health care facility    -  Primary  ? Relevant Orders  ? CBC with Differential/Platelet  ? Comprehensive metabolic panel  ?  Lipid panel  ? Essential hypertension      ? Relevant Medications  ? Azilsartan Medoxomil (EDARBI) 40 MG TABS  ? tadalafil (CIALIS) 20 MG tablet  ? Other Relevant Orders  ? CBC with Differential/Platelet  ? Comprehensive metabolic panel  ? DOE (dyspnea on exertion)      ? Relevant Orders  ? Lipid panel  ? EKG 12-Lead  ? Ambulatory referral to Cardiology  ? ?  ? ?Return in about 1 year (around 03/13/2023) for cpe fasting . ? ?  ? ?Jill Alexanders, MD ? ? ?

## 2022-03-13 LAB — COMPREHENSIVE METABOLIC PANEL
ALT: 28 IU/L (ref 0–44)
AST: 25 IU/L (ref 0–40)
Albumin/Globulin Ratio: 1.7 (ref 1.2–2.2)
Albumin: 4.6 g/dL (ref 3.8–4.8)
Alkaline Phosphatase: 133 IU/L — ABNORMAL HIGH (ref 44–121)
BUN/Creatinine Ratio: 9 — ABNORMAL LOW (ref 10–24)
BUN: 7 mg/dL — ABNORMAL LOW (ref 8–27)
Bilirubin Total: 0.6 mg/dL (ref 0.0–1.2)
CO2: 21 mmol/L (ref 20–29)
Calcium: 10.3 mg/dL — ABNORMAL HIGH (ref 8.6–10.2)
Chloride: 96 mmol/L (ref 96–106)
Creatinine, Ser: 0.78 mg/dL (ref 0.76–1.27)
Globulin, Total: 2.7 g/dL (ref 1.5–4.5)
Glucose: 109 mg/dL — ABNORMAL HIGH (ref 70–99)
Potassium: 4.8 mmol/L (ref 3.5–5.2)
Sodium: 136 mmol/L (ref 134–144)
Total Protein: 7.3 g/dL (ref 6.0–8.5)
eGFR: 100 mL/min/{1.73_m2} (ref >59)

## 2022-03-13 LAB — LIPID PANEL
Chol/HDL Ratio: 7 ratio — ABNORMAL HIGH (ref 0.0–5.0)
Cholesterol, Total: 251 mg/dL — ABNORMAL HIGH (ref 100–199)
HDL: 36 mg/dL — ABNORMAL LOW (ref >39)
LDL Chol Calc (NIH): 143 mg/dL — ABNORMAL HIGH (ref 0–99)
Triglycerides: 387 mg/dL — ABNORMAL HIGH (ref 0–149)
VLDL Cholesterol Cal: 72 mg/dL — ABNORMAL HIGH (ref 5–40)

## 2022-03-13 LAB — CBC WITH DIFFERENTIAL/PLATELET
Basophils Absolute: 0.1 10*3/uL (ref 0.0–0.2)
Basos: 1 %
EOS (ABSOLUTE): 0.1 10*3/uL (ref 0.0–0.4)
Eos: 1 %
Hematocrit: 52.4 % — ABNORMAL HIGH (ref 37.5–51.0)
Hemoglobin: 18 g/dL — ABNORMAL HIGH (ref 13.0–17.7)
Immature Grans (Abs): 0.1 10*3/uL (ref 0.0–0.1)
Immature Granulocytes: 1 %
Lymphocytes Absolute: 2 10*3/uL (ref 0.7–3.1)
Lymphs: 25 %
MCH: 29.3 pg (ref 26.6–33.0)
MCHC: 34.4 g/dL (ref 31.5–35.7)
MCV: 85 fL (ref 79–97)
Monocytes Absolute: 0.6 10*3/uL (ref 0.1–0.9)
Monocytes: 7 %
Neutrophils Absolute: 5.2 10*3/uL (ref 1.4–7.0)
Neutrophils: 65 %
Platelets: 301 10*3/uL (ref 150–450)
RBC: 6.14 x10E6/uL — ABNORMAL HIGH (ref 4.14–5.80)
RDW: 13.3 % (ref 11.6–15.4)
WBC: 8.1 10*3/uL (ref 3.4–10.8)

## 2022-03-13 MED ORDER — ATORVASTATIN CALCIUM 20 MG PO TABS
20.0000 mg | ORAL_TABLET | Freq: Every day | ORAL | 3 refills | Status: DC
Start: 2022-03-13 — End: 2023-03-16

## 2022-03-13 NOTE — Addendum Note (Signed)
Addended by: Denita Lung on: 03/13/2022 12:53 PM ? ? Modules accepted: Orders ? ?

## 2022-03-27 ENCOUNTER — Encounter: Payer: Self-pay | Admitting: Physician Assistant

## 2022-04-02 ENCOUNTER — Encounter: Payer: Self-pay | Admitting: Interventional Cardiology

## 2022-04-02 ENCOUNTER — Ambulatory Visit: Payer: 59 | Admitting: Interventional Cardiology

## 2022-04-02 VITALS — BP 132/74 | HR 80 | Ht 71.0 in | Wt 214.0 lb

## 2022-04-02 DIAGNOSIS — R0609 Other forms of dyspnea: Secondary | ICD-10-CM | POA: Diagnosis not present

## 2022-04-02 DIAGNOSIS — E782 Mixed hyperlipidemia: Secondary | ICD-10-CM

## 2022-04-02 DIAGNOSIS — I700 Atherosclerosis of aorta: Secondary | ICD-10-CM

## 2022-04-02 DIAGNOSIS — Z8249 Family history of ischemic heart disease and other diseases of the circulatory system: Secondary | ICD-10-CM | POA: Diagnosis not present

## 2022-04-02 MED ORDER — METOPROLOL TARTRATE 100 MG PO TABS: ORAL_TABLET | ORAL | 0 refills | Status: DC

## 2022-04-02 NOTE — Patient Instructions (Addendum)
Medication Instructions:  Your physician recommends that you continue on your current medications as directed. Please refer to the Current Medication list given to you today.  *If you need a refill on your cardiac medications before your next appointment, please call your pharmacy*   Lab Work: Lab work to be done today--BMP If you have labs (blood work) drawn today and your tests are completely normal, you will receive your results only by: [place] (if you have MyChart) OR A paper copy in the mail If you have any lab test that is abnormal or we need to change your treatment, we will call you to review the results.   Testing/Procedures: Your physician has requested that you have an echocardiogram. Echocardiography is a painless test that uses sound waves to create images of your heart. It provides your doctor with information about the size and shape of your heart and how well your heart's chambers and valves are working. This procedure takes approximately one hour. There are no restrictions for this procedure.  Your physician has requested that you have cardiac CT. Cardiac computed tomography (CT) is a painless test that uses an x-ray machine to take clear, detailed pictures of your heart. For further information please visit [email or web address]. Please follow instruction sheet as given.     Follow-Up: At [facility], you and your health needs are our priority.  As part of our continuing mission to provide you with exceptional heart care, we have created designated Provider Care Teams.  These Care Teams include your primary Cardiologist (physician) and Advanced Practice Providers (APPs -  Physician Assistants and Nurse Practitioners) who all work together to provide you with the care you need, when you need it.  We recommend signing up for the patient portal called "MyChart".  Sign up information is provided on this After Visit Summary.  MyChart is used to connect with patients  for Virtual Visits (Telemedicine).  Patients are able to view lab/test results, encounter notes, upcoming appointments, etc.  Non-urgent messages can be sent to your provider as well.   To learn more about what you can do with MyChart, go to [email or web address].    Your next appointment:   12 month(s)  The format for your next appointment:   In Person  Provider:   Larae Grooms, MD     Other Instructions   Your cardiac CT will be scheduled at one of the below locations:   [facility] [address] [place], [place] [address] [phone]  [place] [address] [place], [place] [address] [phone]  If scheduled at [facility], please arrive at the [facility] and Children's Entrance (Entrance C2) of [facility] 30 minutes prior to test start time. You can use the FREE valet parking offered at entrance C (encouraged to control the heart rate for the test)  Proceed to the [facility] Radiology Department (first floor) to check-in and test prep.  All radiology patients and guests should use entrance C2 at [facility], accessed from [facility], even though the hospital's physical address listed is [address]    If scheduled at [facility], please arrive 15 mins early for check-in and test prep.  Please follow these instructions carefully (unless otherwise directed):  Hold all erectile dysfunction medications at least 3 days (72 hrs) prior to test.  On the Night Before the Test: Be sure to Drink plenty of water. Do  not consume any caffeinated/decaffeinated beverages or chocolate 12 hours prior to your test. Do not take any antihistamines 12 hours prior to your test. If the patient has contrast allergy:   On the Day of the Test: Drink plenty of water until 1 hour prior to the test. Do not eat any food 4 hours prior to the  test. You may take your regular medications prior to the test.  Take metoprolol (Lopressor) two hours prior to test. HOLD Furosemide/Hydrochlorothiazide morning of the test.      After the Test: Drink plenty of water. After receiving IV contrast, you may experience a mild flushed feeling. This is normal. On occasion, you may experience a mild rash up to 24 hours after the test. This is not dangerous. If this occurs, you can take Benadryl 25 mg and increase your fluid intake. If you experience trouble breathing, this can be serious. If it is severe call 911 IMMEDIATELY. If it is mild, please call our office. If you take any of these medications: Glipizide/Metformin, Avandament, Glucavance, please do not take 48 hours after completing test unless otherwise instructed.  We will call to schedule your test 2-4 weeks out understanding that some insurance companies will need an authorization prior to the service being performed.   For non-scheduling related questions, please contact the cardiac imaging nurse navigator should you have any questions/concerns: Marchia Bond, Cardiac Imaging Nurse Navigator Gordy Clement, Cardiac Imaging Nurse Navigator [facility] Direct Office Dial: [phone]   For scheduling needs, including cancellations and rescheduling, please call [place], [phone].   Important Information About Sugar

## 2022-04-02 NOTE — Progress Notes (Signed)
Cardiology Office Note   Date:  04/02/2022   ID:  Micheal Hodge, DOB Aug 04, 1956, MRN [id number]  PCP:  Denita Lung, MD    No chief complaint on file.  DOE  [facility] Readings from Last 3 Encounters:  04/02/22 214 lb (97.1 kg)  03/12/22 211 lb 9.6 oz (96 kg)  10/20/21 215 lb (97.5 kg)       History of Present Illness: Micheal Hodge is a 66 y.o. male who is being seen today for the evaluation of DOE at the request of Denita Lung, MD.   He had COVID in Dec 2022.  He was not hospitalized.  It was managed at home.  He did not take Paxlovid but was given an antibiotic and steroids.  He feels he cannot get rid of the congestion.    Prior to [place], he felt well walking up stairs, at work.  Now he has DOE with walking up stairs.    In 2012, CT abdomen showed: "There is scattered minimal atherosclerotic calcifications of a  normal caliber abdominal aorta.  The major branch vessels of the  abdominal aorta, including the IMA, are patent."  Works for [employer] [place].  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  Occasional walking at home.  Push lawnmower is also one of his activities.   Denies : Chest pain. Dizziness. Leg edema. Nitroglycerin use. Orthopnea. Palpitations. Paroxysmal nocturnal dyspnea.  Syncope.    Persistent shortness of breath.       Past Medical History:  Diagnosis Date   Allergy    Arthritis    Blood glucose elevated 09/07/2018   Bowel obstruction (HCC)    Colon polyps    Diverticulitis    Diverticulosis    ED (erectile dysfunction)    Elevated cholesterol    Esophageal stricture    GERD (gastroesophageal reflux disease)    Headache    High blood pressure    Hyperlipidemia    Hypertension    Inflammatory bowel disease    Migraine headache    Pneumonia    Rosacea     Past Surgical History:  Procedure Laterality Date   COLONOSCOPY  2009   NASAL SEPTUM SURGERY     UPPER GASTROINTESTINAL ENDOSCOPY  2009     Current Outpatient  Medications  Medication Sig Dispense Refill   atorvastatin (LIPITOR) 20 MG tablet Take 1 tablet (20 mg total) by mouth daily. 90 tablet 3   Azilsartan Medoxomil (EDARBI) 40 MG TABS Take 1 tablet by mouth daily. 30 tablet 11   Cetirizine HCl (ALL DAY ALLERGY PO) Take by mouth.     FIBER COMPLETE PO Take 1 tablet by mouth 2 (two) times daily.     Multiple Vitamin (MULTI-VITAMIN) tablet Take by mouth.     tadalafil (CIALIS) 20 MG tablet Take 1 tablet (20 mg total) by mouth daily as needed for erectile dysfunction. 20 tablet 5   No current facility-administered medications for this visit.    Allergies:   Poison ivy extract    Social History:  The patient  reports that he quit smoking about 35 years ago. His smoking use included cigarettes. He has never used smokeless tobacco. He reports current alcohol use of about 20.0 standard drinks per week. He reports that he does not use drugs.   Family History:  The patient's family history includes Cancer in his mother; Diabetes in his mother; Heart disease in his father and mother; Hyperlipidemia in his mother; Hypertension in  his father.    ROS:  Please see the history of present illness.   Otherwise, review of systems are positive for congestion.   All other systems are reviewed and negative.    PHYSICAL EXAM: VS:  BP 132/74   Pulse 80   Ht '5\' 11"'$  (1.803 m)   Wt 214 lb (97.1 kg)   SpO2 98%   BMI 29.85 kg/m  , BMI Body mass index is 29.85 kg/m. GEN: Well nourished, well developed, in no acute distress HEENT: normal Neck: no JVD, carotid bruits, or masses Cardiac: RRR; no murmurs, rubs, or gallops,no edema  Respiratory:  clear to auscultation bilaterally, normal work of breathing GI: soft, nontender, nondistended, + BS MS: no deformity or atrophy; 2+ PT pulses bilaterally Skin: warm and dry, no rash Neuro:  Strength and sensation are intact Psych: euthymic mood, full affect   EKG:   The ekg ordered 03/12/22 demonstrates NSR,  nonspecific ST changes   Recent Labs: 03/12/2022: ALT 28; BUN 7; Creatinine, Ser 0.78; Hemoglobin 18.0; Platelets 301; Potassium 4.8; Sodium 136   Lipid Panel    Component Value Date/Time   CHOL 251 (H) 03/12/2022 1536   TRIG 387 (H) 03/12/2022 1536   HDL 36 (L) 03/12/2022 1536   CHOLHDL 7.0 (H) 03/12/2022 1536   CHOLHDL 6.8 (H) 01/21/2016 0817   VLDL NOT CALC 01/21/2016 0817   LDLCALC 143 (H) 03/12/2022 1536     Other studies Reviewed: Additional studies/ records that were reviewed today with results demonstrating: labs reviewed.   ASSESSMENT AND PLAN:  DOE: Worse since COVID.  Plan for echo and CTA coronaries. Metoprolol 100 mg x 1 before CTA.  Both parents with significant heart disease.  Shortness of breath and exercise intolerance could be an anginal equivalent. Aortic atherosclerosis/hyperlipidemia: LDL 143, TG 387 in 5/23.  Atorvastatin 20 mg started. FAmily h/o CAD: Both parents with stents placed.  Father with AICD.    Current medicines are reviewed at length with the patient today.  The patient concerns regarding his medicines were addressed.  The following changes have been made:  No change  Labs/ tests ordered today include:  No orders of the defined types were placed in this encounter.   Recommend 150 minutes/week of aerobic exercise Low fat, low carb, high fiber diet recommended  Disposition:   FU in 1 year, sooner if CTA is abnormal   Signed, Larae Grooms, MD  04/02/2022 3:16 PM    [facility] [place], [place], [place]  [address] Phone: [phone]; Fax: [phone]

## 2022-04-03 LAB — BASIC METABOLIC PANEL
BUN/Creatinine Ratio: 14 (ref 10–24)
BUN: 11 mg/dL (ref 8–27)
CO2: 24 mmol/L (ref 20–29)
Calcium: 9.7 mg/dL (ref 8.6–10.2)
Chloride: 97 mmol/L (ref 96–106)
Creatinine, Ser: 0.8 mg/dL (ref 0.76–1.27)
Glucose: 132 mg/dL — ABNORMAL HIGH (ref 70–99)
Potassium: 4.2 mmol/L (ref 3.5–5.2)
Sodium: 139 mmol/L (ref 134–144)
eGFR: 99 mL/min/{1.73_m2} (ref >59)

## 2022-04-21 ENCOUNTER — Ambulatory Visit: Payer: 59 | Admitting: Physician Assistant

## 2022-04-22 ENCOUNTER — Ambulatory Visit (HOSPITAL_COMMUNITY): Payer: 59 | Attending: Cardiology

## 2022-04-22 DIAGNOSIS — R0609 Other forms of dyspnea: Secondary | ICD-10-CM | POA: Insufficient documentation

## 2022-04-22 LAB — ECHOCARDIOGRAM COMPLETE
Area-P 1/2: 1.78 cm2
S' Lateral: 3.1 cm

## 2022-04-24 ENCOUNTER — Telehealth (HOSPITAL_COMMUNITY): Payer: Self-pay | Admitting: *Deleted

## 2022-04-28 ENCOUNTER — Ambulatory Visit (HOSPITAL_COMMUNITY): Admission: RE | Admit: 2022-04-28 | Payer: 59 | Source: Ambulatory Visit

## 2022-04-29 ENCOUNTER — Ambulatory Visit (HOSPITAL_COMMUNITY)
Admission: RE | Admit: 2022-04-29 | Discharge: 2022-04-29 | Disposition: A | Discharge status: Home / Self Care | Payer: 59 | Source: Ambulatory Visit | Attending: Interventional Cardiology | Admitting: Interventional Cardiology

## 2022-04-29 DIAGNOSIS — R0609 Other forms of dyspnea: Secondary | ICD-10-CM | POA: Diagnosis present

## 2022-04-29 DIAGNOSIS — I2510 Atherosclerotic heart disease of native coronary artery without angina pectoris: Secondary | ICD-10-CM | POA: Diagnosis not present

## 2022-04-29 MED ORDER — NITROGLYCERIN 0.4 MG SL SUBL
0.8000 mg | SUBLINGUAL_TABLET | Freq: Once | SUBLINGUAL | Status: AC
  Administered 2022-04-29: 0.8 mg via SUBLINGUAL

## 2022-04-29 MED ORDER — IOHEXOL 350 MG/ML SOLN
100.0000 mL | Freq: Once | INTRAVENOUS | Status: AC | PRN
  Administered 2022-04-29: 100 mL via INTRAVENOUS

## 2022-04-29 MED ORDER — NITROGLYCERIN 0.4 MG SL SUBL
SUBLINGUAL_TABLET | SUBLINGUAL | Status: AC
  Filled 2022-04-29: qty 2

## 2022-05-07 ENCOUNTER — Encounter: Payer: Self-pay | Admitting: Family Medicine

## 2022-05-14 ENCOUNTER — Encounter: Payer: Self-pay | Admitting: Physician Assistant

## 2022-05-14 ENCOUNTER — Ambulatory Visit: Payer: 59 | Admitting: Physician Assistant

## 2022-05-14 VITALS — BP 126/80 | HR 76 | Ht 71.0 in | Wt 209.5 lb

## 2022-05-14 DIAGNOSIS — R109 Unspecified abdominal pain: Secondary | ICD-10-CM

## 2022-05-14 DIAGNOSIS — R1310 Dysphagia, unspecified: Secondary | ICD-10-CM | POA: Diagnosis not present

## 2022-05-14 DIAGNOSIS — K219 Gastro-esophageal reflux disease without esophagitis: Secondary | ICD-10-CM

## 2022-05-14 MED ORDER — NA SULFATE-K SULFATE-MG SULF 17.5-3.13-1.6 GM/177ML PO SOLN: 1.0000 | ORAL | 0 refills | Status: DC

## 2022-05-14 MED ORDER — OMEPRAZOLE 40 MG PO CPDR: 40.0000 mg | DELAYED_RELEASE_CAPSULE | Freq: Every day | ORAL | 5 refills | Status: DC

## 2022-05-14 NOTE — Progress Notes (Signed)
Chief Complaint: GERD, history of esophageal stricture, abdominal pain, dysphagia  HPI:     Micheal Hodge is a 66 year old male, known to Dr. Silverio Decamp, with a past medical history as listed below including esophageal stricture and reflux, who was referred to me by Denita Lung, MD for a complaint of reflux, history of esophageal stricture, abdominal pain and dysphagia.      01/12/2008 colonoscopy with diverticulosis and otherwise normal.    08/13/2008 EGD with dilation of a stricture.    06/02/2018 office visit with Tye Savoy, NP for chronically altered bowel habits consisting of constipation and loose stool as well as left-sided abdominal pain.  At that time a negative Cologuard in March 2019, recommended he try Benefiber and MiraLAX as well as Dicyclomine.    Today, the patient tells me that he feels like his throat has episodes where it is "blocked off".  He has researched his symptoms online and feels like he may have a hiatal hernia.  Tells me the symptoms only occur when he is eating and he will feel like he just has to stop.  He tells me that there is intense pain when this occurs and if he can just relax sometimes he can swallow, other times he has to regurgitate/vomit in order for the sensation to go away.  This has been increasing in severity and now happening 5-6 times a month over the past 1 to 2 years.  Also seems to happen around the same time that he has a lot of heartburn and reflux.  Tells me he was previously on Omeprazole but "I do not want to be on a medicine every day of my life forever", so he had stopped this.  Currently only using Pepto tabs as needed.    Also discusses ongoing chronic left sided abdominal pain.  He does not recall being given Dicyclomine in the past so he is unsure if this was helpful.  He is worried as he discusses a vague history of what sounds like perforated diverticulitis in the past and question of a stricture and possibly needing surgery.  He has not  had a colonoscopy in some time.    Denies fever, chills, weight loss, blood in his stool or symptoms that awaken him from sleep.  Past Medical History:  Diagnosis Date   Allergy    Arthritis    Blood glucose elevated 09/07/2018   Bowel obstruction (HCC)    Colon polyps    Diverticulitis    Diverticulosis    ED (erectile dysfunction)    Elevated cholesterol    Esophageal stricture    GERD (gastroesophageal reflux disease)    Headache    High blood pressure    Hyperlipidemia    Hypertension    Inflammatory bowel disease    Migraine headache    Pneumonia    Rosacea     Past Surgical History:  Procedure Laterality Date   COLONOSCOPY  2009   NASAL SEPTUM SURGERY     UPPER GASTROINTESTINAL ENDOSCOPY  2009    Current Outpatient Medications  Medication Sig Dispense Refill   Azilsartan Medoxomil (EDARBI) 40 MG TABS Take 1 tablet by mouth daily. 30 tablet 11   Cetirizine HCl (ALL DAY ALLERGY PO) Take by mouth as needed.     FIBER COMPLETE PO Take 1 tablet by mouth 2 (two) times daily.     Multiple Vitamin (MULTI-VITAMIN) tablet Take by mouth.     tadalafil (CIALIS) 20 MG tablet Take 1 tablet (20  mg total) by mouth daily as needed for erectile dysfunction. 20 tablet 5   atorvastatin (LIPITOR) 20 MG tablet Take 1 tablet (20 mg total) by mouth daily. (Patient not taking: Reported on 05/14/2022) 90 tablet 3   No current facility-administered medications for this visit.    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    Employer: [employer]  Tobacco Use   Smoking status: Former    Years: 15.00    Types: Cigarettes    Quit date: 11/02/1986    Years since quitting: 35.5   Smokeless tobacco: Never  Vaping Use   Vaping Use: Never used  Substance and Sexual Activity   Alcohol use: Yes    Alcohol/week: 20.0 standard drinks of alcohol    Types: 20 Cans of beer per week   Drug use: No   Sexual activity: Not Currently  Other Topics Concern   Not on file  Social History Narrative   Not on file   Social Determinants of Health   Financial Resource Strain: Not on file  Food Insecurity: Not on file  Transportation Needs: Not on file  Physical Activity: Not on file  Stress: Not on file  Social Connections: Not on file  Intimate Partner Violence: Not on file    Review of Systems:    Constitutional: No weight loss, fever or chills Skin: No rash Cardiovascular: No chest pain  Respiratory: No SOB Gastrointestinal: See HPI and otherwise negative Genitourinary: No dysuria  Neurological: No headache, dizziness or syncope Musculoskeletal: No new muscle or joint pain Hematologic: No bleeding  Psychiatric: No history of depression or anxiety   Physical Exam:  Vital signs: BP 126/80 (BP Location: Left Arm, Patient Position: Sitting, Cuff Size: Normal)   Pulse 76   Ht '5\' 11"'$  (1.803 m) Comment: height measured without shoes  Wt 209 lb 8 oz (95 kg)   BMI 29.22 kg/m    Constitutional:   Pleasant Caucasian male appears to be in NAD, Well developed, Well nourished, alert and cooperative Head:  Normocephalic and atraumatic. Eyes:   PEERL, EOMI. No icterus. Conjunctiva pink. Ears:  Normal auditory acuity. Neck:  Supple Throat: Oral cavity and pharynx without inflammation, swelling or lesion.  Respiratory: Respirations even and unlabored. Lungs clear to auscultation  bilaterally.   No wheezes, crackles, or rhonchi.  Cardiovascular: Normal S1, S2. No MRG. Regular rate and rhythm. No peripheral edema, cyanosis or pallor.  Gastrointestinal:  Soft, nondistended, nontender. No rebound or guarding. Normal bowel sounds. No appreciable masses or hepatomegaly. Rectal:  Not performed.  Msk:  Symmetrical without gross deformities. Without edema, no deformity or joint abnormality.  Neurologic:  Alert and  oriented x4;  grossly normal neurologically.  Skin:   Dry and intact  without significant lesions or rashes. Psychiatric: Demonstrates good judgement and reason without abnormal affect or behaviors.  RELEVANT LABS AND IMAGING: CBC    Component Value Date/Time   WBC 8.1 03/12/2022 1536   WBC 11.3 (H) 10/14/2021 1534   RBC 6.14 (H) 03/12/2022 1536   RBC 5.50 10/14/2021 1534   HGB 18.0 (H) 03/12/2022 1536   HCT 52.4 (H) 03/12/2022 1536   PLT 301 03/12/2022 1536   MCV 85 03/12/2022 1536   MCH 29.3 03/12/2022 1536   MCH 30.2 10/14/2021 1534   MCHC 34.4 03/12/2022 1536   MCHC 33.6 10/14/2021 1534   RDW 13.3 03/12/2022 1536   LYMPHSABS 2.0 03/12/2022 1536   MONOABS 0.6 01/21/2016 0817   EOSABS 0.1 03/12/2022 1536   BASOSABS 0.1 03/12/2022 1536    CMP     Component Value Date/Time   NA 139 04/02/2022 1544   K 4.2 04/02/2022 1544   CL 97 04/02/2022 1544   CO2 24 04/02/2022 1544   GLUCOSE 132 (H) 04/02/2022 1544   GLUCOSE 169 (H) 10/14/2021 1534   BUN 11 04/02/2022 1544   CREATININE 0.80 04/02/2022 1544   CREATININE 0.70 01/21/2016 0817   CALCIUM 9.7 04/02/2022 1544   PROT 7.3 03/12/2022 1536   ALBUMIN 4.6 03/12/2022 1536   AST 25 03/12/2022 1536   ALT 28 03/12/2022 1536   ALKPHOS 133 (H) 03/12/2022 1536   BILITOT 0.6 03/12/2022 1536   GFRNONAA >60 10/14/2021 1534   GFRAA 108 03/12/2020 1427    Assessment: 1.  Dysphagia: Sometimes 5-6 times a month where he feels like food gets stuck and he has to stop eating and regurgitate or vomit it in order  to keep eating, previous stricture in 2009 dilated at time of EGD then, chronic reflux symptoms; likely stricture 2.  GERD: With above 3.  Chronic left-sided abdominal pain: Question relation to diverticulosis versus IBS  Plan: 1.  Scheduled patient for diagnostic EGD with dilation and colonoscopy in the [facility] with Dr. Silverio Decamp.  Did provide the patient a detailed list of risks for the procedures and he agrees to proceed. Patient is appropriate for endoscopic procedure(s) in the ambulatory ([facility]) setting.  2.  Discussed dysphagia and antidysphagia measures.  Would recommend that we start Omeprazole 40 mg daily which may prevent the severity of attacks and decrease the frequency.  Patient again reminds me he does not want to be on this medicine forever.  Told him we could decide how long he needs to stay on this after time of EGD. 3.  Discussed chronic left-sided abdominal pain.  If colonoscopy unrevealing then could trial Dicyclomine again.  Patient does not remember doing this in the past. 4.  Patient to follow in clinic per recommendations after time of procedures as above.  Ellouise Newer, PA-C [facility] 05/14/2022, 11:28 AM  Cc: Denita Lung, MD

## 2022-05-14 NOTE — Patient Instructions (Signed)
If you are age 64 or younger, your body mass index should be between 19-25. Your Body mass index is 29.22 kg/m. If this is out of the aformentioned range listed, please consider follow up with your Primary Care Provider.  ________________________________________________________  The [facility] providers would like to encourage you to use [facility] to communicate with providers for non-urgent requests or questions.  Due to long hold times on the telephone, sending your provider a message by [facility] may be a faster and more efficient way to get a response.  Please allow 48 business hours for a response.  Please remember that this is for non-urgent requests.  _______________________________________________________  Dennis Bast have been scheduled for an endoscopy and colonoscopy. Please follow the written instructions given to you at your visit today. Please pick up your prep supplies at the pharmacy within the next 1-3 days. If you use inhalers (even only as needed), please bring them with you on the day of your procedure.  Due to recent changes in healthcare laws, you may see the results of your imaging and laboratory studies on MyChart before your provider has had a chance to review them.  We understand that in some cases there may be results that are confusing or concerning to you. Not all laboratory results come back in the same time frame and the provider may be waiting for multiple results in order to interpret others.  Please give [place] 48 hours in order for your provider to thoroughly review all the results before contacting the office for clarification of your results.   We have sent the following medications to your pharmacy for you to pick up at your convenience:  START: omeprazole '40mg'$  one capsule 30 minutes prior to breakfast meal each day.  Thank you for entrusting me with your care and choosing [facility].  Lovett Calender

## 2022-05-18 ENCOUNTER — Ambulatory Visit: Payer: 59 | Admitting: Family Medicine

## 2022-06-01 ENCOUNTER — Encounter: Payer: 59 | Admitting: Gastroenterology

## 2022-07-08 ENCOUNTER — Encounter: Payer: Self-pay | Admitting: Internal Medicine

## 2022-07-15 ENCOUNTER — Encounter: Payer: Self-pay | Admitting: Gastroenterology

## 2022-07-17 ENCOUNTER — Telehealth: Payer: Self-pay | Admitting: Gastroenterology

## 2022-07-17 NOTE — Telephone Encounter (Signed)
Spoke with pt. And coworker had already sent new prep instructions to pt. Via my chart and verified that he received them and that he has prep.

## 2022-07-17 NOTE — Telephone Encounter (Signed)
Spoke to patient, patient stated he could not find instructions for procedure coming up on 07-22-22.   Could you please resend to my chart. Thank you

## 2022-07-22 ENCOUNTER — Ambulatory Visit (AMBULATORY_SURGERY_CENTER): Payer: 59 | Admitting: Gastroenterology

## 2022-07-22 ENCOUNTER — Encounter: Payer: Self-pay | Admitting: Gastroenterology

## 2022-07-22 VITALS — BP 118/69 | HR 73 | Temp 97.3°F | Resp 15 | Ht 71.0 in | Wt 209.0 lb

## 2022-07-22 DIAGNOSIS — R109 Unspecified abdominal pain: Secondary | ICD-10-CM

## 2022-07-22 DIAGNOSIS — R1310 Dysphagia, unspecified: Secondary | ICD-10-CM | POA: Diagnosis not present

## 2022-07-22 DIAGNOSIS — K5730 Diverticulosis of large intestine without perforation or abscess without bleeding: Secondary | ICD-10-CM

## 2022-07-22 DIAGNOSIS — K649 Unspecified hemorrhoids: Secondary | ICD-10-CM | POA: Diagnosis not present

## 2022-07-22 DIAGNOSIS — K222 Esophageal obstruction: Secondary | ICD-10-CM | POA: Diagnosis not present

## 2022-07-22 DIAGNOSIS — K5140 Inflammatory polyps of colon without complications: Secondary | ICD-10-CM

## 2022-07-22 DIAGNOSIS — K208 Other esophagitis without bleeding: Secondary | ICD-10-CM | POA: Diagnosis not present

## 2022-07-22 DIAGNOSIS — K2960 Other gastritis without bleeding: Secondary | ICD-10-CM | POA: Diagnosis not present

## 2022-07-22 DIAGNOSIS — K219 Gastro-esophageal reflux disease without esophagitis: Secondary | ICD-10-CM

## 2022-07-22 DIAGNOSIS — D120 Benign neoplasm of cecum: Secondary | ICD-10-CM

## 2022-07-22 MED ORDER — OMEPRAZOLE 40 MG PO CPDR: 40.0000 mg | DELAYED_RELEASE_CAPSULE | Freq: Two times a day (BID) | ORAL | 5 refills | Status: DC

## 2022-07-22 MED ORDER — SODIUM CHLORIDE 0.9 % IV SOLN: 500.0000 mL | Freq: Once | INTRAVENOUS | Status: DC

## 2022-07-22 NOTE — Patient Instructions (Signed)
Handout on modified soft diet provided.  Please adhere to this diet until you have another EGD and/or Dr. Woodward Ku instructions.    A RX was sent to your pharmacy for Omeprazole '40mg'$  twice daily.  Handouts on esophagitis and stricture, GERD, diverticulosis, Modified soft diet for esophageal dilation, and polyps provided  YOU HAD AN ENDOSCOPIC PROCEDURE TODAY AT [facility]:   Refer to the procedure report that was given to you for any specific questions about what was found during the examination.  If the procedure report does not answer your questions, please call your gastroenterologist to clarify.  If you requested that your care partner not be given the details of your procedure findings, then the procedure report has been included in a sealed envelope for you to review at your convenience later.  YOU SHOULD EXPECT: Some feelings of bloating in the abdomen. Passage of more gas than usual.  Walking can help get rid of the air that was put into your GI tract during the procedure and reduce the bloating. If you had a lower endoscopy (such as a colonoscopy or flexible sigmoidoscopy) you may notice spotting of blood in your stool or on the toilet paper. If you underwent a bowel prep for your procedure, you may not have a normal bowel movement for a few days.  Please Note:  You might notice some irritation and congestion in your nose or some drainage.  This is from the oxygen used during your procedure.  There is no need for concern and it should clear up in a day or so.  SYMPTOMS TO REPORT IMMEDIATELY:  Following lower endoscopy (colonoscopy or flexible sigmoidoscopy):  Excessive amounts of blood in the stool  Significant tenderness or worsening of abdominal pains  Swelling of the abdomen that is new, acute  Fever of 100F or higher  Following upper endoscopy (EGD)  Vomiting of blood or coffee ground material  New chest pain or pain under the shoulder blades  Painful or  persistently difficult swallowing  New shortness of breath  Fever of 100F or higher  Black, tarry-looking stools  For urgent or emergent issues, a gastroenterologist can be reached at any hour by calling [phone]. Do not use MyChart messaging for urgent concerns.    DIET:  Soft diet until next EGD (3 months) and with MD approval at that time.   Drink plenty of fluids but you should avoid alcoholic beverages for 24 hours.  ACTIVITY:  You should plan to take it easy for the rest of today and you should NOT DRIVE or use heavy machinery until tomorrow (because of the sedation medicines used during the test).    FOLLOW UP: Our staff will call the number listed on your records the next business day following your procedure.  We will call around 7:15- 8:00 am to check on you and address any questions or concerns that you may have regarding the information given to you following your procedure. If we do not reach you, we will leave a message.     If any biopsies were taken you will be contacted by phone or by letter within the next 1-3 weeks.  Please call us at [phone] if you have not heard about the biopsies in 3 weeks.    SIGNATURES/CONFIDENTIALITY: You and/or your care partner have signed paperwork which will be entered into your electronic medical record.  These signatures attest to the fact that that the information above on your After Visit Summary has  been reviewed and is understood.  Full responsibility of the confidentiality of this discharge information lies with you and/or your care-partner.  

## 2022-07-22 NOTE — Progress Notes (Unsigned)
[facility] History and Physical   Primary Care Physician:  Denita Lung, MD   Reason for Procedure:  GERD, dysphagia, colon cancer screening, incidental llq abd pain  Plan:    EGD and colonoscopy with possible interventions as needed     HPI: Micheal Hodge is a very pleasant 66 y.o. male here for EGD and colonoscopy for further evaluation of dysphagia and colon cancer screening. Please refer to office visit note 05/14/22 for further details   The risks and benefits as well as alternatives of endoscopic procedure(s) have been discussed and reviewed. All questions answered. The patient agrees to proceed.    Past Medical History:  Diagnosis Date   Allergy    Arthritis    Blood glucose elevated 09/07/2018   Bowel obstruction ([place])    2012   Colon polyps    Diverticulitis    Diverticulosis    ED (erectile dysfunction)    Elevated cholesterol    Esophageal stricture    GERD (gastroesophageal reflux disease)    Headache    High blood pressure    Hyperlipidemia    Hypertension    Inflammatory bowel disease    Migraine headache    Pneumonia    Rosacea     Past Surgical History:  Procedure Laterality Date   COLONOSCOPY  11/03/2007   LUMBAR MICRODISCECTOMY     L5-S1   NASAL SEPTUM SURGERY     UPPER GASTROINTESTINAL ENDOSCOPY  11/03/2007    Prior to Admission medications   Medication Sig Start Date End Date Taking? Authorizing Provider  Azilsartan Medoxomil (EDARBI) 40 MG TABS Take 1 tablet by mouth daily. 03/12/22  Yes Denita Lung, MD  Cetirizine HCl (ALL DAY ALLERGY PO) Take by mouth as needed.   Yes [provider]  omeprazole (PRILOSEC) 40 MG capsule Take 1 capsule (40 mg total) by mouth daily. Take 30 minutes prior to breakfast meal each day. 05/14/22  Yes Levin Erp, PA  atorvastatin (LIPITOR) 20 MG tablet Take 1 tablet (20 mg total) by mouth daily. Patient not taking: Reported on 05/14/2022 03/13/22 03/13/23  Denita Lung,  MD  FIBER COMPLETE PO Take 1 tablet by mouth 2 (two) times daily.    [provider]  Multiple Vitamin (MULTI-VITAMIN) tablet Take by mouth.    [provider]  tadalafil (CIALIS) 20 MG tablet Take 1 tablet (20 mg total) by mouth daily as needed for erectile dysfunction. 03/12/22   Denita Lung, MD    Current Outpatient Medications  Medication Sig Dispense Refill   Azilsartan Medoxomil (EDARBI) 40 MG TABS Take 1 tablet by mouth daily. 30 tablet 11   Cetirizine HCl (ALL DAY ALLERGY PO) Take by mouth as needed.     omeprazole (PRILOSEC) 40 MG capsule Take 1 capsule (40 mg total) by mouth daily. Take 30 minutes prior to breakfast meal each day. 30 capsule 5   atorvastatin (LIPITOR) 20 MG tablet Take 1 tablet (20 mg total) by mouth daily. (Patient not taking: Reported on 05/14/2022) 90 tablet 3   FIBER COMPLETE PO Take 1 tablet by mouth 2 (two) times daily.     Multiple Vitamin (MULTI-VITAMIN) tablet Take by mouth.     tadalafil (CIALIS) 20 MG tablet Take 1 tablet (20 mg total) by mouth daily as needed for erectile dysfunction. 20 tablet 5   Current Facility-Administered Medications  Medication Dose Route Frequency Provider Last Rate Last Admin   0.9 %  sodium chloride infusion  500 mL  Intravenous Once Mauri Pole, MD        Allergies as of 07/22/2022 - Review Complete 07/22/2022  Allergen Reaction Noted   Poison ivy extract Rash 11/22/2018    Family History  Problem Relation Age of Onset   Diabetes Mother    Hyperlipidemia Mother    Heart disease Mother        stents   Cancer Mother        Melanoma   Colon polyps Mother    Atrial fibrillation Mother    Hypertension Father    Heart disease Father    Heart failure Father    Glaucoma Father    Hemophilia Brother    Colon cancer Neg Hx    Esophageal cancer Neg Hx    Stomach cancer Neg Hx    Rectal cancer Neg Hx     Social History   Socioeconomic History   Marital status: Divorced    Spouse name:  Not on file   Number of children: 1   Years of education: Not on file   Highest education level: Not on file  Occupational History   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    Employer: [employer]  Tobacco Use   Smoking status: Some Days    Years: 15.00    Types: Cigarettes, Cigars    Last attempt to quit: 11/02/1986    Years since quitting: 35.7   Smokeless tobacco: Never   Tobacco comments:    Quit smoking cigarettes in 1988 but smoke an occasional Cigar  Vaping Use   Vaping Use: Never used  Substance and Sexual Activity   Alcohol use: Yes    Alcohol/week: 20.0 standard drinks of alcohol    Types: 20 Cans of beer per week    Comment: 3-4 per day   Drug use: No   Sexual activity: Not Currently  Other Topics Concern   Not on file  Social History Narrative   Not on file   Social Determinants of Health   Financial Resource Strain: Not on file  Food Insecurity: Not on file  Transportation Needs: Not on file  Physical Activity: Not on file  Stress: Not on file  Social Connections: Not on file  Intimate Partner Violence: Not on file    Review of Systems:  All other review of systems negative except as mentioned in the HPI.  Physical Exam: Vital signs in last 24 hours: Blood Pressure 128/70   Pulse 78   Temperature (Abnormal) 97.3 F (36.3 C) (Skin)   Height '5\' 11"'$  (1.803 m)   Weight 209 lb (94.8 kg)   Oxygen Saturation 95%   Body Mass Index 29.15 kg/m  General:   Alert, NAD Lungs:  Clear .   Heart:  Regular rate and rhythm Abdomen:  Soft, nontender and nondistended. Neuro/Psych:  Alert and cooperative. Normal mood and affect. A and O x 3  Reviewed labs, radiology imaging, old records and pertinent past GI work up  Patient is appropriate for planned procedure(s) and anesthesia in an ambulatory setting   K. Denzil Magnuson , MD [phone]

## 2022-07-22 NOTE — Op Note (Signed)
[place] Patient Name: Micheal Hodge Procedure Date: 07/22/2022 9:17 AM MRN: [id number] Endoscopist: Mauri Pole , MD Age: 66 Referring MD:  Date of Birth: 18-Oct-1956 Gender: Male Account #: [id number] Procedure:                Colonoscopy Indications:              Screening for colorectal malignant neoplasm,                            Incidental abdominal pain noted Medicines:                Monitored Anesthesia Care Procedure:                Pre-Anesthesia Assessment:                           - Prior to the procedure, a History and Physical                            was performed, and patient medications and                            allergies were reviewed. The patient's tolerance of                            previous anesthesia was also reviewed. The risks                            and benefits of the procedure and the sedation                            options and risks were discussed with the patient.                            All questions were answered, and informed consent                            was obtained. Prior Anticoagulants: The patient has                            taken no previous anticoagulant or antiplatelet                            agents. ASA Grade Assessment: II - A patient with                            mild systemic disease. After reviewing the risks                            and benefits, the patient was deemed in                            satisfactory condition to undergo the procedure.  After obtaining informed consent, the colonoscope                            was passed under direct vision. Throughout the                            procedure, the patient's blood pressure, pulse, and                            oxygen saturations were monitored continuously. The                            Olympus PCF-H190DL ([id number]) Colonoscope was                            introduced through the anus and  advanced to the the                            cecum, identified by appendiceal orifice and                            ileocecal valve. The colonoscopy was performed                            without difficulty. The patient tolerated the                            procedure well. The quality of the bowel                            preparation was good. The ileocecal valve,                            appendiceal orifice, and rectum were photographed. Scope In: 9:43:25 AM Scope Out: 9:58:16 AM Scope Withdrawal Time: 0 hours 11 minutes 16 seconds  Total Procedure Duration: 0 hours 14 minutes 51 seconds  Findings:                 The perianal and digital rectal examinations were                            normal.                           Scattered small and large-mouthed diverticula were                            found in the sigmoid colon, descending colon,                            transverse colon and ascending colon. There was                            narrowing of the colon in association with the  diverticular opening. Peri-diverticular erythema                            was seen. There was evidence of an impacted                            diverticulum.                           A less than 1 mm polyp was found in the cecum. The                            polyp was sessile. The polyp was removed with a                            cold biopsy forceps. Resection and retrieval were                            complete.                           Non-bleeding external and internal hemorrhoids were                            found during retroflexion. The hemorrhoids were                            medium-sized. Complications:            No immediate complications. Estimated Blood Loss:     Estimated blood loss was minimal. Impression:               - Severe diverticulosis in the sigmoid colon, in                            the descending colon, in the  transverse colon and                            in the ascending colon. There was narrowing of the                            colon in association with the diverticular opening.                            Peri-diverticular erythema was seen. There was                            evidence of an impacted diverticulum.                           - One less than 1 mm polyp in the cecum, removed                            with a cold biopsy forceps. Resected and retrieved.                           -  Non-bleeding external and internal hemorrhoids. Recommendation:           - Patient has a contact number available for                            emergencies. The signs and symptoms of potential                            delayed complications were discussed with the                            patient. Return to normal activities tomorrow.                            Written discharge instructions were provided to the                            patient.                           - Resume previous diet.                           - Continue present medications.                           - Await pathology results.                           - Repeat colonoscopy in 5-10 years for surveillance                            based on pathology results.                           - See the other procedure note for documentation of                            additional recommendations. Mauri Pole, MD 07/22/2022 10:06:54 AM This report has been signed electronically.

## 2022-07-22 NOTE — Op Note (Signed)
[place] Patient Name: Micheal Hodge Procedure Date: 07/22/2022 9:18 AM MRN: [id number] Endoscopist: Mauri Pole , MD Age: 66 Referring MD:  Date of Birth: 06/14/1956 Gender: Male Account #: [id number] Procedure:                Upper GI endoscopy Indications:              Dysphagia, Esophageal reflux Medicines:                Monitored Anesthesia Care Procedure:                Pre-Anesthesia Assessment:                           - Prior to the procedure, a History and Physical                            was performed, and patient medications and                            allergies were reviewed. The patient's tolerance of                            previous anesthesia was also reviewed. The risks                            and benefits of the procedure and the sedation                            options and risks were discussed with the patient.                            All questions were answered, and informed consent                            was obtained. Prior Anticoagulants: The patient has                            taken no previous anticoagulant or antiplatelet                            agents. ASA Grade Assessment: II - A patient with                            mild systemic disease. After reviewing the risks                            and benefits, the patient was deemed in                            satisfactory condition to undergo the procedure.                           After obtaining informed consent, the endoscope was  passed under direct vision. Throughout the                            procedure, the patient's blood pressure, pulse, and                            oxygen saturations were monitored continuously. The                            Endoscope was introduced through the mouth, and                            advanced to the second part of duodenum. The upper                            GI endoscopy was  accomplished without difficulty.                            The patient tolerated the procedure well. Scope In: Scope Out: Findings:                 LA Grade C (one or more mucosal breaks continuous                            between tops of 2 or more mucosal folds, less than                            75% circumference) esophagitis was found 38 to 42                            cm from the incisors. Biopsies were taken with a                            cold forceps for histology.                           One benign-appearing, intrinsic moderate                            (circumferential scarring or stenosis; an endoscope                            may pass) stenosis was found 40 to 42 cm from the                            incisors. The stenosis was traversed. A TTS dilator                            was passed through the scope. Dilation with an                            09-13-12 mm balloon dilator was performed to 13 mm.  The dilation site was examined following endoscope                            reinsertion and showed mild mucosal disruption.                           Patchy mild inflammation characterized by                            congestion (edema) and erythema was found in the                            entire examined stomach. Biopsies were taken with a                            cold forceps for Helicobacter pylori testing.                           The cardia and gastric fundus were normal on                            retroflexion.                           The examined duodenum was normal. Complications:            No immediate complications. Estimated Blood Loss:     Estimated blood loss was minimal. Impression:               - LA Grade C erosive esophagitis. Biopsied.                           - Benign-appearing esophageal stenosis. Dilated.                           - Gastritis. Biopsied.                           - Normal examined  duodenum. Recommendation:           - Patient has a contact number available for                            emergencies. The signs and symptoms of potential                            delayed complications were discussed with the                            patient. Return to normal activities tomorrow.                            Written discharge instructions were provided to the                            patient.                           -  Resume previous diet.                           - Continue present medications.                           - Await pathology results.                           - Repeat upper endoscopy in 3 months to check                            healing.                           - Return to GI clinic at the next available                            appointment in 2 months.                           - Follow an antireflux regimen.                           - Use Prilosec (omeprazole) 40 mg PO BID. Rx for 30                            days with 5 refills Mauri Pole, MD 07/22/2022 10:11:07 AM This report has been signed electronically.

## 2022-07-22 NOTE — Progress Notes (Signed)
Attempted to schedule repeat EGD in 3 months per MD's instructions.  There is no template available after November for Dr. Virgilio Frees, RN,BSN

## 2022-07-22 NOTE — Progress Notes (Unsigned)
Called to room to assist during endoscopic procedure.  Patient ID and intended procedure confirmed with present staff. Received instructions for my participation in the procedure from the performing physician.  

## 2022-07-22 NOTE — Progress Notes (Unsigned)
A/ox3, pleased with MAC, report to RN 

## 2022-07-23 ENCOUNTER — Telehealth: Payer: Self-pay | Admitting: *Deleted

## 2022-07-23 NOTE — Telephone Encounter (Signed)
  Follow up Call-     07/22/2022    8:11 AM  Call back number  Post procedure Call Back phone  # [phone]  Permission to leave phone message Yes   [facility]

## 2022-08-06 ENCOUNTER — Encounter: Payer: Self-pay | Admitting: Gastroenterology

## 2022-08-11 ENCOUNTER — Encounter: Payer: Self-pay | Admitting: Internal Medicine

## 2022-08-24 ENCOUNTER — Encounter: Payer: Self-pay | Admitting: Internal Medicine

## 2022-11-10 ENCOUNTER — Encounter: Payer: Self-pay | Admitting: Internal Medicine

## 2022-11-10 DIAGNOSIS — I10 Essential (primary) hypertension: Secondary | ICD-10-CM

## 2023-01-11 ENCOUNTER — Telehealth: Payer: Self-pay | Admitting: Family Medicine

## 2023-01-11 MED ORDER — IRBESARTAN-HYDROCHLOROTHIAZIDE 150-12.5 MG PO TABS: 1.0000 | ORAL_TABLET | Freq: Every day | ORAL | 1 refills | Status: DC

## 2023-01-11 NOTE — Telephone Encounter (Signed)
Left message for pt

## 2023-01-11 NOTE — Telephone Encounter (Signed)
P.A. was denied as expected, plan exclusion.  Called pharmacy and Avalide went thru for $0 co pay.  Called pt left message

## 2023-01-11 NOTE — Telephone Encounter (Signed)
Pt called back, he is now out of his Earnest Rosier, said insurance no longer covering.  Told him about mail order thru [employer] for $55 a month & he states he would rather you switch him to a covered alternative which would be cheaper for him.  He said he didn't remember any issues with the previous meds he was on. I went ahead & did a P.A. but preferred meds are Irbesartan & Candesartan & pt hasn't tried either of these.  Can he be switched?

## 2023-03-16 ENCOUNTER — Ambulatory Visit: Payer: 59 | Admitting: Family Medicine

## 2023-03-16 ENCOUNTER — Encounter: Payer: Self-pay | Admitting: Family Medicine

## 2023-03-16 VITALS — BP 138/88 | HR 82 | Ht 72.0 in | Wt 217.8 lb

## 2023-03-16 DIAGNOSIS — Z0000 Encounter for general adult medical examination without abnormal findings: Secondary | ICD-10-CM

## 2023-03-16 DIAGNOSIS — E785 Hyperlipidemia, unspecified: Secondary | ICD-10-CM

## 2023-03-16 DIAGNOSIS — K219 Gastro-esophageal reflux disease without esophagitis: Secondary | ICD-10-CM

## 2023-03-16 DIAGNOSIS — M545 Low back pain, unspecified: Secondary | ICD-10-CM

## 2023-03-16 DIAGNOSIS — N529 Male erectile dysfunction, unspecified: Secondary | ICD-10-CM

## 2023-03-16 DIAGNOSIS — J309 Allergic rhinitis, unspecified: Secondary | ICD-10-CM | POA: Diagnosis not present

## 2023-03-16 DIAGNOSIS — Z23 Encounter for immunization: Secondary | ICD-10-CM | POA: Diagnosis not present

## 2023-03-16 DIAGNOSIS — R718 Other abnormality of red blood cells: Secondary | ICD-10-CM | POA: Diagnosis not present

## 2023-03-16 DIAGNOSIS — I10 Essential (primary) hypertension: Secondary | ICD-10-CM

## 2023-03-16 DIAGNOSIS — Z9889 Other specified postprocedural states: Secondary | ICD-10-CM

## 2023-03-16 DIAGNOSIS — Z8669 Personal history of other diseases of the nervous system and sense organs: Secondary | ICD-10-CM

## 2023-03-16 DIAGNOSIS — G600 Hereditary motor and sensory neuropathy: Secondary | ICD-10-CM

## 2023-03-16 DIAGNOSIS — G8929 Other chronic pain: Secondary | ICD-10-CM

## 2023-03-16 DIAGNOSIS — Z8719 Personal history of other diseases of the digestive system: Secondary | ICD-10-CM

## 2023-03-16 LAB — POCT URINALYSIS DIP (PROADVANTAGE DEVICE)
Bilirubin, UA: NEGATIVE
Blood, UA: NEGATIVE
Glucose, UA: NEGATIVE mg/dL
Ketones, POC UA: NEGATIVE mg/dL
Leukocytes, UA: NEGATIVE
Nitrite, UA: NEGATIVE
Protein Ur, POC: NEGATIVE mg/dL
Specific Gravity, Urine: 1.01
Urobilinogen, Ur: 0.2
pH, UA: 6 (ref 5.0–8.0)

## 2023-03-16 LAB — LIPID PANEL

## 2023-03-16 MED ORDER — TADALAFIL 20 MG PO TABS
20.0000 mg | ORAL_TABLET | Freq: Every day | ORAL | 5 refills | Status: AC | PRN
Start: 2023-03-16 — End: ?

## 2023-03-16 MED ORDER — IRBESARTAN-HYDROCHLOROTHIAZIDE 300-12.5 MG PO TABS
1.0000 | ORAL_TABLET | Freq: Every day | ORAL | 3 refills | Status: DC
Start: 2023-03-16 — End: 2024-04-11

## 2023-03-16 NOTE — Progress Notes (Signed)
   Subjective:    Patient ID: Micheal Hodge, male    DOB: 01/11/1956, 67 y.o.   MRN: [id number]  HPI He is here for complete examination.  He continues on Avalide and is having no difficulty with that.  He is not using Prilosec as he is not having any reflux type symptoms at the present time.  His allergies seem to be under good control but did recommend he add Flonase to his regimen on an as-needed basis.  He would also like a refill on his Cialis.  He is considering retirement in the near future.  He still does have occasional difficulty with his back and does have a neuropathy.  He otherwise has no particular concerns or complaints.  Otherwise family and social history as well as health maintenance and immunizations was reviewed   Review of Systems  All other systems reviewed and are negative.      Objective:   Physical Exam Alert and in no distress. Tympanic membranes and canals are normal. Pharyngeal area is normal. Neck is supple without adenopathy or thyromegaly. Cardiac exam shows a regular sinus rhythm without murmurs or gallops. Lungs are clear to auscultation. The 10-year ASCVD risk score (Arnett DK, et al., 2019) is: 30.4%   Values used to calculate the score:     Age: 63 years     Sex: Male     Is Non-Hispanic African American: No     Diabetic: No     Tobacco smoker: Yes     Systolic Blood Pressure: 138 mmHg     Is BP treated: Yes     HDL Cholesterol: 36 mg/dL     Total Cholesterol: 242 mg/dL        Assessment & Plan:  Routine general medical examination at a health care facility - Plan: POCT Urinalysis DIP (Proadvantage Device)  Primary hypertension - Plan: CBC with Differential/Platelet, Comprehensive metabolic panel, irbesartan-hydrochlorothiazide (AVALIDE) 300-12.5 MG tablet  Gastroesophageal reflux disease without esophagitis  Hereditary sensorimotor neuropathy  Allergic rhinitis, unspecified seasonality, unspecified trigger  Hyperlipidemia LDL goal <130 -  Plan: Lipid panel  History of esophageal stricture  History of migraine headaches  Chronic low back pain, unspecified back pain laterality, unspecified whether sciatica present  H/O lumbar discectomy  Need for Tdap vaccination - Plan: Tdap vaccine greater than or equal to 7yo IM  Need for vaccination against Streptococcus pneumoniae - Plan: Pneumococcal conjugate vaccine 20-valent (Prevnar 20)  Erectile dysfunction, unspecified erectile dysfunction type - Plan: tadalafil (CIALIS) 20 MG tablet I discussed retirement with him and he seems to have a good plan for this.

## 2023-03-17 LAB — COMPREHENSIVE METABOLIC PANEL
ALT: 37 IU/L (ref 0–44)
AST: 26 IU/L (ref 0–40)
Albumin/Globulin Ratio: 1.8 (ref 1.2–2.2)
Albumin: 4.6 g/dL (ref 3.9–4.9)
Alkaline Phosphatase: 133 IU/L — ABNORMAL HIGH (ref 44–121)
BUN/Creatinine Ratio: 8 — ABNORMAL LOW (ref 10–24)
BUN: 6 mg/dL — ABNORMAL LOW (ref 8–27)
Bilirubin Total: 0.6 mg/dL (ref 0.0–1.2)
CO2: 22 mmol/L (ref 20–29)
Calcium: 9.9 mg/dL (ref 8.6–10.2)
Chloride: 100 mmol/L (ref 96–106)
Creatinine, Ser: 0.79 mg/dL (ref 0.76–1.27)
Globulin, Total: 2.6 g/dL (ref 1.5–4.5)
Glucose: 134 mg/dL — ABNORMAL HIGH (ref 70–99)
Potassium: 4.7 mmol/L (ref 3.5–5.2)
Sodium: 141 mmol/L (ref 134–144)
Total Protein: 7.2 g/dL (ref 6.0–8.5)
eGFR: 99 mL/min/{1.73_m2} (ref >59)

## 2023-03-17 LAB — CBC WITH DIFFERENTIAL/PLATELET
Basophils Absolute: 0.1 10*3/uL (ref 0.0–0.2)
Basos: 1 %
EOS (ABSOLUTE): 0.1 10*3/uL (ref 0.0–0.4)
Eos: 2 %
Hematocrit: 55.4 % — ABNORMAL HIGH (ref 37.5–51.0)
Hemoglobin: 18.7 g/dL — ABNORMAL HIGH (ref 13.0–17.7)
Immature Grans (Abs): 0.1 10*3/uL (ref 0.0–0.1)
Immature Granulocytes: 1 %
Lymphocytes Absolute: 1.9 10*3/uL (ref 0.7–3.1)
Lymphs: 25 %
MCH: 30.3 pg (ref 26.6–33.0)
MCHC: 33.8 g/dL (ref 31.5–35.7)
MCV: 90 fL (ref 79–97)
Monocytes Absolute: 0.7 10*3/uL (ref 0.1–0.9)
Monocytes: 9 %
Neutrophils Absolute: 4.9 10*3/uL (ref 1.4–7.0)
Neutrophils: 62 %
Platelets: 295 10*3/uL (ref 150–450)
RBC: 6.18 x10E6/uL — ABNORMAL HIGH (ref 4.14–5.80)
RDW: 12.7 % (ref 11.6–15.4)
WBC: 7.8 10*3/uL (ref 3.4–10.8)

## 2023-03-17 LAB — LIPID PANEL
Chol/HDL Ratio: 6.7 ratio — ABNORMAL HIGH (ref 0.0–5.0)
LDL Chol Calc (NIH): 159 mg/dL — ABNORMAL HIGH (ref 0–99)
VLDL Cholesterol Cal: 47 mg/dL — ABNORMAL HIGH (ref 5–40)

## 2023-03-17 MED ORDER — ROSUVASTATIN CALCIUM 20 MG PO TABS
20.0000 mg | ORAL_TABLET | Freq: Every day | ORAL | 3 refills | Status: DC
Start: 2023-03-17 — End: 2023-04-23

## 2023-03-17 NOTE — Addendum Note (Signed)
Addended by: Ronnald Nian on: 03/17/2023 07:19 AM   Modules accepted: Orders

## 2023-03-23 LAB — SPECIMEN STATUS REPORT

## 2023-03-23 LAB — HGB A1C W/O EAG: Hgb A1c MFr Bld: 6.5 % — ABNORMAL HIGH (ref 4.8–5.6)

## 2023-04-01 ENCOUNTER — Telehealth: Payer: Self-pay | Admitting: Family Medicine

## 2023-04-01 NOTE — Telephone Encounter (Signed)
Left vm to call back and schedule an appointment per dr.Lalonde

## 2023-04-01 NOTE — Telephone Encounter (Signed)
-----   Message from Britt Boozer, [place] sent at 04/01/2023  9:17 AM EDT ----- Can you call pt to schedule a visit ----- Message ----- From: Benjiman Core, CMA Sent: 04/01/2023   9:02 AM EDT To: Britt Boozer, CMA; [facility] Clinical Pool   ----- Message ----- From: Ronnald Nian, MD Sent: 04/01/2023   8:17 AM EDT To: Benjiman Core, CMA  Set him up for virtual visit to discuss the hemoglobin A1c and cholesterol information.

## 2023-04-02 NOTE — Telephone Encounter (Signed)
-----   Message from Benjiman Core, [place] sent at 04/01/2023  3:02 PM EDT -----  ----- Message ----- From: Ronnald Nian, MD Sent: 04/01/2023   8:17 AM EDT To: Benjiman Core, CMA  Set him up for virtual visit to discuss the hemoglobin A1c and cholesterol information.

## 2023-04-02 NOTE — Telephone Encounter (Signed)
Called and left vm for him to call back and schedule a virtual with Dr.Lalonde to go over labs.

## 2023-04-12 ENCOUNTER — Other Ambulatory Visit: Payer: Self-pay

## 2023-04-12 ENCOUNTER — Inpatient Hospital Stay: Payer: 59

## 2023-04-12 ENCOUNTER — Encounter: Payer: Self-pay | Admitting: Hematology and Oncology

## 2023-04-12 ENCOUNTER — Inpatient Hospital Stay: Payer: 59 | Attending: Hematology and Oncology | Admitting: Hematology and Oncology

## 2023-04-12 ENCOUNTER — Encounter: Payer: Self-pay | Admitting: Family Medicine

## 2023-04-12 VITALS — BP 151/88 | HR 80 | Temp 98.0°F | Resp 18 | Ht 72.0 in | Wt 210.8 lb

## 2023-04-12 DIAGNOSIS — D751 Secondary polycythemia: Secondary | ICD-10-CM

## 2023-04-12 DIAGNOSIS — Z79899 Other long term (current) drug therapy: Secondary | ICD-10-CM | POA: Insufficient documentation

## 2023-04-12 DIAGNOSIS — F1020 Alcohol dependence, uncomplicated: Secondary | ICD-10-CM | POA: Diagnosis not present

## 2023-04-12 DIAGNOSIS — E1142 Type 2 diabetes mellitus with diabetic polyneuropathy: Secondary | ICD-10-CM | POA: Diagnosis not present

## 2023-04-12 DIAGNOSIS — I10 Essential (primary) hypertension: Secondary | ICD-10-CM | POA: Diagnosis not present

## 2023-04-12 DIAGNOSIS — G600 Hereditary motor and sensory neuropathy: Secondary | ICD-10-CM

## 2023-04-12 DIAGNOSIS — E119 Type 2 diabetes mellitus without complications: Secondary | ICD-10-CM

## 2023-04-12 HISTORY — DX: Type 2 diabetes mellitus without complications: E11.9

## 2023-04-12 LAB — CBC WITH DIFFERENTIAL (CANCER CENTER ONLY)
Abs Immature Granulocytes: 0.04 10*3/uL (ref 0.00–0.07)
Basophils Absolute: 0.1 10*3/uL (ref 0.0–0.1)
Basophils Relative: 1 %
Eosinophils Absolute: 0.2 10*3/uL (ref 0.0–0.5)
Eosinophils Relative: 2 %
HCT: 50.8 % (ref 39.0–52.0)
Hemoglobin: 17.7 g/dL — ABNORMAL HIGH (ref 13.0–17.0)
Immature Granulocytes: 0 %
Lymphocytes Relative: 25 %
Lymphs Abs: 2.4 10*3/uL (ref 0.7–4.0)
MCH: 30.7 pg (ref 26.0–34.0)
MCHC: 34.8 g/dL (ref 30.0–36.0)
MCV: 88.2 fL (ref 80.0–100.0)
Monocytes Absolute: 0.7 10*3/uL (ref 0.1–1.0)
Monocytes Relative: 7 %
Neutro Abs: 6.2 10*3/uL (ref 1.7–7.7)
Neutrophils Relative %: 65 %
Platelet Count: 301 10*3/uL (ref 150–400)
RBC: 5.76 MIL/uL (ref 4.22–5.81)
RDW: 12.2 % (ref 11.5–15.5)
WBC Count: 9.6 10*3/uL (ref 4.0–10.5)
nRBC: 0 % (ref 0.0–0.2)

## 2023-04-12 LAB — URIC ACID: Uric Acid, Serum: 7 mg/dL (ref 3.7–8.6)

## 2023-04-12 LAB — FERRITIN: Ferritin: 182 ng/mL (ref 24–336)

## 2023-04-12 MED ORDER — GABAPENTIN 300 MG PO CAPS: 300.0000 mg | ORAL_CAPSULE | Freq: Every day | ORAL | 1 refills | Status: AC

## 2023-04-12 NOTE — Assessment & Plan Note (Addendum)
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

## 2023-04-12 NOTE — Assessment & Plan Note (Addendum)
The cause of his elevated hemoglobin is multifactorial, could be due to poor oral fluid intake, untreated obstructive sleep apnea, and others The patient has donated blood in the past but quit in 2017 when his parents were ill There is no contraindication for him to donate blood I recommend ordering some blood work today to rule out myeloproliferative disorder I recommend the patient to start 81 mg aspirin daily recommend increase oral fluid intake I will call him with test results

## 2023-04-12 NOTE — Assessment & Plan Note (Signed)
We discussed the importance of staying abstinent His chronic alcohol use is exacerbating his neuropathy, insulin resistance and increases risk of liver disease

## 2023-04-12 NOTE — Progress Notes (Signed)
[facility] CONSULT NOTE  Patient Care Team: Ronnald Nian, MD as PCP - General (Family Medicine) Corky Crafts, MD as PCP - Cardiology (Cardiology)    ASSESSMENT & PLAN Secondary erythrocytosis The cause of his elevated hemoglobin is multifactorial, could be due to poor oral fluid intake, untreated obstructive sleep apnea, and others The patient has donated blood in the past but quit in 2017 when his parents were ill There is no contraindication for him to donate blood I recommend ordering some blood work today to rule out myeloproliferative disorder I recommend the patient to start 81 mg aspirin daily recommend increase oral fluid intake I will call him with test results  Hypertension His blood pressure is elevated today, could be due to anxiety He will continue his current antihypertensives  Peripheral neuropathy He has extensive workup in the past by different neurologist I suspect his peripheral neuropathy is due to chronic excessive alcohol use, compounded by possibility of insulin resistance with chronic hyperglycemia and possible poor circulation from uncontrolled hypercholesterolemia I recommend the patient to gradually stop drinking  He is scheduled to see his primary care doctor to discuss recent abnormal blood work I also recommend the patient to modify his lifestyle and reduce sugar intake We discussed risk and benefits of trial of gabapentin and he is willing to try I will give him low-dose gabapentin of 300 mg daily to try  Alcoholism [facility]) We discussed the importance of staying abstinent His chronic alcohol use is exacerbating his neuropathy, insulin resistance and increases risk of liver disease  Diabetes mellitus (HCC) The patient has borderline elevated hemoglobin A1c in the past Most recently, his hemoglobin A1c is very high The patient admits that he likes to eat a lot of sugary stuff I encouraged the patient to start modifying his  diet and lifestyle and work with his primary care doctor to get his blood sugar under control  Orders Placed This Encounter  Procedures   CBC with Differential (Cancer Center Only)    Standing Status:   Future    Number of Occurrences:   1    Standing Expiration Date:   04/11/2024   Erythropoietin    Standing Status:   Future    Number of Occurrences:   1    Standing Expiration Date:   04/11/2024   Jak 2 [id number] ([employer])    Standing Status:   Future    Number of Occurrences:   1    Standing Expiration Date:   04/11/2024   Uric acid    Standing Status:   Future    Number of Occurrences:   1    Standing Expiration Date:   04/11/2024   Ferritin    Standing Status:   Future    Number of Occurrences:   1    Standing Expiration Date:   04/11/2024   Artis Delay, MD  04/12/2023 1:39 PM  The total time spent in the appointment was 60 minutes encounter with patients including review of chart and various tests results, discussions about plan of care and coordination of care plan   All questions were answered. The patient knows to call the clinic with any problems, questions or concerns. No barriers to learning was detected.  Artis Delay, MD 6/10/20241:39 PM      CHIEF COMPLAINTS/PURPOSE OF CONSULTATION:  Erythrocytosis  HISTORY OF PRESENTING ILLNESS:  Micheal Hodge 67 y.o. male is here because of elevated hemoglobin.  He was found to have abnormal CBC  from recent blood work I have the opportunity to review his CBC dated back to 2012 He has mild intermittent elevated hemoglobin since 2017 On December 27, 2017, his hemoglobin was 17.9 Last month, on Mar 13, 2023, his hemoglobin was 18, repeat labs 3 days later confirm elevated hemoglobin of 18.7  He denies intermittent headaches, shortness of breath on exertion, frequent leg cramps and occasional chest pain.  He never suffer from diagnosis of blood clot.  There is no prior diagnosis of obstructive sleep apnea.  He was told that his  snore very loudly.  The patient is a mouth breather due to chronic sinus congestion  He has never been evaluated for obstructive sleep apnea  The patient denies weight loss or skin itching.  The patient is an ex- smoker but quit many years ago He used to donate blood on a regular basis but quit in 2017 when he has to take care of his parents who were ill. The patient has significant peripheral neuropathy since 2008 and has seen 2 different neurologists and had extensive workup which I was able to review test electronically He drinks alcohol on a very regular basis for many years, on average, 3 beers and 3 hot liquids per day His neuropathy is so bad he was offered gabapentin but he never tried taking it.  Instead, he drinks alcohol so that he can sleep better  MEDICAL HISTORY:  Past Medical History:  Diagnosis Date   Allergy    Arthritis    Blood glucose elevated 09/07/2018   Bowel obstruction (HCC)    2012   Colon polyps    Diabetes mellitus (HCC) 04/12/2023   Diverticulitis    Diverticulosis    ED (erectile dysfunction)    Elevated cholesterol    Esophageal stricture    GERD (gastroesophageal reflux disease)    Headache    High blood pressure    Hyperlipidemia    Hypertension    Inflammatory bowel disease    Migraine headache    Pneumonia    Rosacea     SURGICAL HISTORY: Past Surgical History:  Procedure Laterality Date   COLONOSCOPY  11/03/2007   LUMBAR MICRODISCECTOMY     L5-S1   NASAL SEPTUM SURGERY     UPPER GASTROINTESTINAL ENDOSCOPY  11/03/2007    SOCIAL HISTORY: Social History   Socioeconomic History   Marital status: Divorced    Spouse name: Not on file   Number of children: 1   Years of education: Not on file   Highest education level: Not on file  Occupational History   Occupation: Arts development officer city of [employer]    Employer: UNEMPLOYED    Employer: [employer]  Tobacco Use   Smoking status: Some Days    Years: 15    Types:  Cigarettes, Cigars    Last attempt to quit: 11/02/1986    Years since quitting: 36.4   Smokeless tobacco: Never   Tobacco comments:    Quit smoking cigarettes in 1988 but smoke an occasional Cigar  Vaping Use   Vaping Use: Never used  Substance and Sexual Activity   Alcohol use: Yes    Alcohol/week: 20.0 standard drinks of alcohol    Types: 20 Cans of beer per week    Comment: 3-4 per day   Drug use: No   Sexual activity: Not Currently  Other Topics Concern   Not on file  Social History Narrative   Not on file   Social Determinants of Health  Financial Resource Strain: Not on file  Food Insecurity: Not on file  Transportation Needs: Not on file  Physical Activity: Not on file  Stress: Not on file  Social Connections: Not on file  Intimate Partner Violence: Not on file    FAMILY HISTORY: Family History  Problem Relation Age of Onset   Diabetes Mother    Hyperlipidemia Mother    Heart disease Mother        stents   Cancer Mother        Melanoma   Colon polyps Mother    Atrial fibrillation Mother    Hypertension Father    Heart disease Father    Heart failure Father    Glaucoma Father    Hemophilia Brother    Colon cancer Neg Hx    Esophageal cancer Neg Hx    Stomach cancer Neg Hx    Rectal cancer Neg Hx     ALLERGIES:  is allergic to poison ivy extract.  MEDICATIONS:  Current Outpatient Medications  Medication Sig Dispense Refill   gabapentin (NEURONTIN) 300 MG capsule Take 1 capsule (300 mg total) by mouth at bedtime. 30 capsule 1   Cetirizine HCl (ALL DAY ALLERGY PO) Take by mouth as needed.     FIBER COMPLETE PO Take 1 tablet by mouth 2 (two) times daily. (Patient not taking: Reported on 03/16/2023)     irbesartan-hydrochlorothiazide (AVALIDE) 300-12.5 MG tablet Take 1 tablet by mouth daily. 90 tablet 3   Multiple Vitamin (MULTI-VITAMIN) tablet Take by mouth.     omeprazole (PRILOSEC) 40 MG capsule Take 1 capsule (40 mg total) by mouth daily. Take 30  minutes prior to breakfast meal each day. (Patient not taking: Reported on 03/16/2023) 30 capsule 5   rosuvastatin (CRESTOR) 20 MG tablet Take 1 tablet (20 mg total) by mouth daily. 90 tablet 3   tadalafil (CIALIS) 20 MG tablet Take 1 tablet (20 mg total) by mouth daily as needed for erectile dysfunction. 20 tablet 5   No current facility-administered medications for this visit.    REVIEW OF SYSTEMS:   Constitutional: Denies fevers, chills or abnormal night sweats Eyes: Denies blurriness of vision, double vision or watery eyes Ears, nose, mouth, throat, and face: Denies mucositis or sore throat Respiratory: Denies cough, dyspnea or wheezes Cardiovascular: Denies palpitation, chest discomfort or lower extremity swelling Gastrointestinal:  Denies nausea, heartburn or change in bowel habits Skin: Denies abnormal skin rashes Lymphatics: Denies new lymphadenopathy or easy bruising Behavioral/Psych: Mood is stable, no new changes  All other systems were reviewed with the patient and are negative.  PHYSICAL EXAMINATION: ECOG PERFORMANCE STATUS: 1 - Symptomatic but completely ambulatory  Vitals:   04/12/23 1156  BP: (!) 151/88  Pulse: 80  Resp: 18  Temp: 98 F (36.7 C)  SpO2: 98%   Filed Weights   04/12/23 1156  Weight: 210 lb 12.8 oz (95.6 kg)    GENERAL:alert, no distress and comfortable SKIN: skin color, texture, turgor are normal, no rashes or significant lesions EYES: normal, conjunctiva are pink and non-injected, sclera clear OROPHARYNX:no exudate, no erythema and lips, buccal mucosa, and tongue normal  NECK: supple, thyroid normal size, non-tender, without nodularity LYMPH:  no palpable lymphadenopathy in the cervical, axillary or inguinal LUNGS: clear to auscultation and percussion with normal breathing effort HEART: regular rate & rhythm and no murmurs and no lower extremity edema ABDOMEN:abdomen soft, non-tender and normal bowel sounds Musculoskeletal:no cyanosis of  digits and no clubbing  PSYCH: alert & oriented x  3 with fluent speech NEURO: no focal motor/sensory deficits  LABORATORY DATA:  I have reviewed the data as listed Recent Results (from the past 2160 hour(s))  CBC with Differential/Platelet     Status: Abnormal   Collection Time: 03/16/23  9:52 AM  Result Value Ref Range   WBC 7.8 3.4 - 10.8 x10E3/uL   RBC 6.18 (H) 4.14 - 5.80 x10E6/uL   Hemoglobin 18.7 (H) 13.0 - 17.7 g/dL   Hematocrit 13.0 (H) 86.5 - 51.0 %   MCV 90 79 - 97 fL   MCH 30.3 26.6 - 33.0 pg   MCHC 33.8 31.5 - 35.7 g/dL   RDW 78.4 69.6 - 29.5 %   Platelets 295 150 - 450 x10E3/uL   Neutrophils 62 Not Estab. %   Lymphs 25 Not Estab. %   Monocytes 9 Not Estab. %   Eos 2 Not Estab. %   Basos 1 Not Estab. %   Neutrophils Absolute 4.9 1.4 - 7.0 x10E3/uL   Lymphocytes Absolute 1.9 0.7 - 3.1 x10E3/uL   Monocytes Absolute 0.7 0.1 - 0.9 x10E3/uL   EOS (ABSOLUTE) 0.1 0.0 - 0.4 x10E3/uL   Basophils Absolute 0.1 0.0 - 0.2 x10E3/uL   Immature Granulocytes 1 Not Estab. %   Immature Grans (Abs) 0.1 0.0 - 0.1 x10E3/uL  Comprehensive metabolic panel     Status: Abnormal   Collection Time: 03/16/23  9:52 AM  Result Value Ref Range   Glucose 134 (H) 70 - 99 mg/dL   BUN 6 (L) 8 - 27 mg/dL   Creatinine, Ser 2.84 0.76 - 1.27 mg/dL   eGFR 99 >13 KG/MWN/0.27   BUN/Creatinine Ratio 8 (L) 10 - 24   Sodium 141 134 - 144 mmol/L   Potassium 4.7 3.5 - 5.2 mmol/L   Chloride 100 96 - 106 mmol/L   CO2 22 20 - 29 mmol/L   Calcium 9.9 8.6 - 10.2 mg/dL   Total Protein 7.2 6.0 - 8.5 g/dL   Albumin 4.6 3.9 - 4.9 g/dL   Globulin, Total 2.6 1.5 - 4.5 g/dL   Albumin/Globulin Ratio 1.8 1.2 - 2.2   Bilirubin Total 0.6 0.0 - 1.2 mg/dL   Alkaline Phosphatase 133 (H) 44 - 121 IU/L   AST 26 0 - 40 IU/L   ALT 37 0 - 44 IU/L  Lipid panel     Status: Abnormal   Collection Time: 03/16/23  9:52 AM  Result Value Ref Range   Cholesterol, Total 242 (H) 100 - 199 mg/dL   Triglycerides 253 (H) 0 - 149  mg/dL   HDL 36 (L) >66 mg/dL   VLDL Cholesterol Cal 47 (H) 5 - 40 mg/dL   LDL Chol Calc (NIH) 440 (H) 0 - 99 mg/dL   Chol/HDL Ratio 6.7 (H) 0.0 - 5.0 ratio    Comment:                                   T. Chol/HDL Ratio                                             Men  Women                               1/2 Avg.Risk  3.4    3.3                                   Avg.Risk  5.0    4.4                                2X Avg.Risk  9.6    7.1                                3X Avg.Risk 23.4   11.0   Hgb A1c w/o eAG     Status: Abnormal   Collection Time: 03/16/23  9:52 AM  Result Value Ref Range   Hgb A1c MFr Bld 6.5 (H) 4.8 - 5.6 %    Comment:          Prediabetes: 5.7 - 6.4          Diabetes: >6.4          Glycemic control for adults with diabetes: <7.0   Specimen status report     Status: None   Collection Time: 03/16/23  9:52 AM  Result Value Ref Range   specimen status report Comment     Comment: Written Authorization Written Authorization Written Authorization Received. Authorization received from [employer] 03-23-2023 Logged by Bernadette Hoit   POCT Urinalysis DIP (Proadvantage Device)     Status: None   Collection Time: 03/16/23 10:07 AM  Result Value Ref Range   Color, UA yellow yellow   Clarity, UA clear clear   Glucose, UA negative negative mg/dL   Bilirubin, UA negative negative   Ketones, POC UA negative negative mg/dL   Specific Gravity, Urine 1.010    Blood, UA negative negative   pH, UA 6.0 5.0 - 8.0   Protein Ur, POC negative negative mg/dL   Urobilinogen, Ur 0.2    Nitrite, UA Negative Negative   Leukocytes, UA Negative Negative  Uric acid     Status: None   Collection Time: 04/12/23 12:27 PM  Result Value Ref Range   Uric Acid, Serum 7.0 3.7 - 8.6 mg/dL    Comment: Performed at [facility], [address], [place], [place] [address]  CBC with Differential (Cancer Center Only)     Status: Abnormal   Collection Time:  04/12/23 12:27 PM  Result Value Ref Range   WBC Count 9.6 4.0 - 10.5 K/uL   RBC 5.76 4.22 - 5.81 MIL/uL   Hemoglobin 17.7 (H) 13.0 - 17.0 g/dL   HCT 19.1 47.8 - 29.5 %   MCV 88.2 80.0 - 100.0 fL   MCH 30.7 26.0 - 34.0 pg   MCHC 34.8 30.0 - 36.0 g/dL   RDW 62.1 30.8 - 65.7 %   Platelet Count 301 150 - 400 K/uL   nRBC 0.0 0.0 - 0.2 %   Neutrophils Relative % 65 %   Neutro Abs 6.2 1.7 - 7.7 K/uL   Lymphocytes Relative 25 %   Lymphs Abs 2.4 0.7 - 4.0 K/uL   Monocytes Relative 7 %   Monocytes Absolute 0.7 0.1 - 1.0 K/uL   Eosinophils Relative 2 %   Eosinophils Absolute 0.2 0.0 - 0.5 K/uL   Basophils Relative 1 %   Basophils Absolute 0.1 0.0 - 0.1 K/uL   Immature Granulocytes 0 %  Abs Immature Granulocytes 0.04 0.00 - 0.07 K/uL    Comment: Performed at [facility], [address], [place], [place] [address]

## 2023-04-12 NOTE — Assessment & Plan Note (Signed)
The patient has borderline elevated hemoglobin A1c in the past Most recently, his hemoglobin A1c is very high The patient admits that he likes to eat a lot of sugary stuff I encouraged the patient to start modifying his diet and lifestyle and work with his primary care doctor to get his blood sugar under control

## 2023-04-12 NOTE — Assessment & Plan Note (Signed)
His blood pressure is elevated today, could be due to anxiety He will continue his current antihypertensives

## 2023-04-13 LAB — ERYTHROPOIETIN: Erythropoietin: 2.4 m[IU]/mL — ABNORMAL LOW (ref 2.6–18.5)

## 2023-04-21 ENCOUNTER — Encounter: Payer: Self-pay | Admitting: Hematology and Oncology

## 2023-04-22 ENCOUNTER — Ambulatory Visit: Payer: 59 | Admitting: Family Medicine

## 2023-04-22 ENCOUNTER — Telehealth: Payer: Self-pay

## 2023-04-22 ENCOUNTER — Encounter: Payer: Self-pay | Admitting: Family Medicine

## 2023-04-22 VITALS — BP 138/88 | HR 88 | Temp 97.6°F | Resp 16 | Wt 212.0 lb

## 2023-04-22 DIAGNOSIS — G720 Drug-induced myopathy: Secondary | ICD-10-CM

## 2023-04-22 DIAGNOSIS — E785 Hyperlipidemia, unspecified: Secondary | ICD-10-CM | POA: Diagnosis not present

## 2023-04-22 DIAGNOSIS — D751 Secondary polycythemia: Secondary | ICD-10-CM

## 2023-04-22 DIAGNOSIS — E119 Type 2 diabetes mellitus without complications: Secondary | ICD-10-CM

## 2023-04-22 DIAGNOSIS — T466X5A Adverse effect of antihyperlipidemic and antiarteriosclerotic drugs, initial encounter: Secondary | ICD-10-CM

## 2023-04-22 LAB — JAK 2 V617F (GENPATH)

## 2023-04-22 MED ORDER — EZETIMIBE 10 MG PO TABS
10.0000 mg | ORAL_TABLET | Freq: Every day | ORAL | 3 refills | Status: DC
Start: 2023-04-22 — End: 2023-09-20

## 2023-04-22 NOTE — Telephone Encounter (Signed)
Called and scheduled appt tomorrow with Dr. Bertis Ruddy to review labs at 1:45 pm. He is aware of appt.

## 2023-04-22 NOTE — Patient Instructions (Signed)
Go to the American diabetes Association website and look at diet.  You can also potentially use the [employer] as a template or the Mediterranean diet..  20 minutes of physical activity on a daily basis or 150 minutes a week of something physical.

## 2023-04-22 NOTE — Progress Notes (Signed)
   Subjective:    Patient ID: Micheal Hodge, male    DOB: 1956-03-17, 67 y.o.   MRN: [id number]  HPI He is here for follow-up visit.  He is scheduled to be seen by hematology to follow-up on the erythrocytosis.  He does have a previous history of difficulty with statin intolerance and did not do well on Lipitor as well as Crestor.  He has had recent blood work which did show hemoglobin A1c of 6.5   Review of Systems     Objective:   Physical Exam The 10-year ASCVD risk score (Arnett DK, et al., 2019) is: 50.3%   Values used to calculate the score:     Age: 43 years     Sex: Male     Is Non-Hispanic African American: No     Diabetic: Yes     Tobacco smoker: Yes     Systolic Blood Pressure: 138 mmHg     Is BP treated: Yes     HDL Cholesterol: 36 mg/dL     Total Cholesterol: 242 mg/dL  Alert and in no distress otherwise not examined      Assessment & Plan:  Secondary erythrocytosis  Hyperlipidemia LDL goal <130 - Plan: ezetimibe (ZETIA) 10 MG tablet  Statin myopathy  New onset type 2 diabetes mellitus (HCC) Discussed the new onset diabetes with him in regard to diet and exercise and risk of stroke, blindness, heart renal disease.  Encouraged him to make further changes in his diet and his exercise.  He has done so poorly with these other medications I think is worthwhile placing him on Setia to work on getting risk reduction.  He was comfortable with that.  Follow-up here 4 months.

## 2023-04-23 ENCOUNTER — Other Ambulatory Visit: Payer: Self-pay

## 2023-04-23 ENCOUNTER — Inpatient Hospital Stay: Payer: 59 | Attending: Hematology and Oncology | Admitting: Hematology and Oncology

## 2023-04-23 VITALS — BP 125/61 | HR 101 | Resp 18 | Ht 72.0 in | Wt 213.4 lb

## 2023-04-23 DIAGNOSIS — D7589 Other specified diseases of blood and blood-forming organs: Secondary | ICD-10-CM | POA: Diagnosis not present

## 2023-04-23 DIAGNOSIS — D751 Secondary polycythemia: Secondary | ICD-10-CM | POA: Insufficient documentation

## 2023-04-24 ENCOUNTER — Encounter: Payer: Self-pay | Admitting: Hematology and Oncology

## 2023-04-24 DIAGNOSIS — D7589 Other specified diseases of blood and blood-forming organs: Secondary | ICD-10-CM | POA: Insufficient documentation

## 2023-04-24 NOTE — Progress Notes (Signed)
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 The patient knows to call the clinic with any problems, questions or concerns. The total time spent in the appointment was 25 minutes encounter with patients including review of chart and various tests results, discussions about plan of care and coordination of care plan   Artis Delay, MD 04/24/2023 5:46 PM  INTERVAL HISTORY: Please see below for problem oriented charting. he returns for treatment follow-up and review of test results  REVIEW OF SYSTEMS:   Constitutional: Denies fevers, chills or abnormal weight loss Eyes: Denies blurriness of vision Ears, nose, mouth, throat, and face: Denies mucositis or sore throat Respiratory: Denies cough, dyspnea or wheezes Cardiovascular: Denies palpitation, chest discomfort or lower extremity swelling Gastrointestinal:  Denies nausea, heartburn or change in bowel habits Skin: Denies abnormal skin rashes Lymphatics: Denies new lymphadenopathy or easy bruising Neurological:Denies numbness, tingling or new weaknesses Behavioral/Psych: Mood is stable, no new changes  All other systems were reviewed with the patient and are negative.  I have reviewed the past medical history, past  surgical history, social history and family history with the patient and they are unchanged from previous note.  ALLERGIES:  is allergic to poison ivy extract.  MEDICATIONS:  Current Outpatient Medications  Medication Sig Dispense Refill   Cetirizine HCl (ALL DAY ALLERGY PO) Take by mouth as needed. (Patient not taking: Reported on 04/22/2023)     ezetimibe (ZETIA) 10 MG tablet Take 1 tablet (10 mg total) by mouth daily. 30 tablet 3   gabapentin (NEURONTIN) 300 MG capsule Take 1 capsule (300 mg total) by mouth at bedtime. 30 capsule 1   irbesartan-hydrochlorothiazide (AVALIDE) 300-12.5 MG tablet Take 1 tablet by mouth daily. 90 tablet 3   Multiple Vitamin (MULTI-VITAMIN) tablet Take by mouth.     tadalafil (CIALIS) 20 MG tablet Take 1 tablet (20 mg total) by mouth daily as needed for erectile dysfunction. 20 tablet 5   No current facility-administered medications for this visit.    SUMMARY OF ONCOLOGIC HISTORY:  He was found to have abnormal CBC from recent blood work I have the opportunity to review his CBC dated back to 2012 He has mild intermittent elevated hemoglobin since 2017 On December 27, 2017, his hemoglobin was 17.9 Last month, on Mar 13, 2023, his hemoglobin was 18, repeat labs 3 days later confirm elevated hemoglobin of 18.7  He denies intermittent headaches, shortness of breath on exertion, frequent leg cramps and occasional chest pain.  He never suffer from diagnosis of blood clot.  There is no prior diagnosis of obstructive sleep apnea.  He was told that his snore very loudly.  The patient is a mouth breather due to chronic sinus congestion  He has never been evaluated for obstructive sleep apnea  The patient denies  weight loss or skin itching.  The patient is an ex- smoker but quit many years ago He used to donate blood on a regular basis but quit in 2017 when he has to take care of his parents who were ill. The patient has significant peripheral neuropathy since 2008  and has seen 2 different neurologists and had extensive workup which I was able to review test electronically He drinks alcohol on a very regular basis for many years, on average, 3 beers and 3 hot liquids per day His neuropathy is so bad he was offered gabapentin but he never tried taking it.  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  PHYSICAL EXAMINATION: ECOG PERFORMANCE STATUS: 0 - Asymptomatic  Vitals:   04/23/23 1346  BP: 125/61  Pulse: (!) 101  Resp: 18  SpO2: 98%   Filed Weights   04/23/23 1346  Weight: 213 lb 6.4 oz (96.8 kg)    GENERAL:alert, no distress and comfortable NEURO: alert & oriented x 3 with fluent speech, no focal motor/sensory deficits  LABORATORY DATA:  I have reviewed the data as listed    Component Value Date/Time   NA 141 03/16/2023 0952   K 4.7 03/16/2023 0952   CL 100 03/16/2023 0952   CO2 22 03/16/2023 0952   GLUCOSE 134 (H) 03/16/2023 0952   GLUCOSE 169 (H) 10/14/2021 1534   BUN 6 (L) 03/16/2023 0952   CREATININE 0.79 03/16/2023 0952   CREATININE 0.70 01/21/2016 0817   CALCIUM 9.9 03/16/2023 0952   PROT 7.2 03/16/2023 0952   ALBUMIN 4.6 03/16/2023 0952   AST 26 03/16/2023 0952   ALT 37 03/16/2023 0952   ALKPHOS 133 (H) 03/16/2023 0952   BILITOT 0.6 03/16/2023 0952   GFRNONAA >60 10/14/2021 1534   GFRAA 108 03/12/2020 1427    No results found for: "SPEP", "UPEP"  Lab Results  Component Value Date   WBC 9.6 04/12/2023   NEUTROABS 6.2 04/12/2023   HGB 17.7 (H) 04/12/2023   HCT 50.8 04/12/2023   MCV 88.2 04/12/2023   PLT 301 04/12/2023      Chemistry      Component Value Date/Time   NA 141 03/16/2023 0952   K 4.7 03/16/2023 0952   CL 100 03/16/2023 0952   CO2 22 03/16/2023 0952   BUN 6 (L) 03/16/2023 0952   CREATININE 0.79 03/16/2023 0952   CREATININE 0.70 01/21/2016 0817      Component Value Date/Time    CALCIUM 9.9 03/16/2023 0952   ALKPHOS 133 (H) 03/16/2023 0952   AST 26 03/16/2023 0952   ALT 37 03/16/2023 0952   BILITOT 0.6 03/16/2023 4098

## 2023-04-24 NOTE — Assessment & Plan Note (Signed)
NGS revealed TET2 We discussed close monitoring and future follow up

## 2023-04-24 NOTE — Assessment & Plan Note (Signed)
JAK 2 mutation is negative for NGS disclosed TET2 abnormalities I believe the cause of erythrocytosis is due to dehydration and possibly OSA I do not recommend him to donate blood I recommend aspirin and increase hydration

## 2023-08-24 ENCOUNTER — Encounter: Payer: Self-pay | Admitting: Family Medicine

## 2023-08-24 ENCOUNTER — Ambulatory Visit: Payer: 59 | Admitting: Family Medicine

## 2023-08-24 VITALS — BP 130/80 | HR 79 | Ht 72.0 in | Wt 200.6 lb

## 2023-08-24 DIAGNOSIS — E785 Hyperlipidemia, unspecified: Secondary | ICD-10-CM | POA: Diagnosis not present

## 2023-08-24 DIAGNOSIS — E1169 Type 2 diabetes mellitus with other specified complication: Secondary | ICD-10-CM

## 2023-08-24 DIAGNOSIS — D751 Secondary polycythemia: Secondary | ICD-10-CM | POA: Diagnosis not present

## 2023-08-24 DIAGNOSIS — E119 Type 2 diabetes mellitus without complications: Secondary | ICD-10-CM

## 2023-08-24 LAB — LIPID PANEL
Chol/HDL Ratio: 5.2 ratio — ABNORMAL HIGH (ref 0.0–5.0)
Cholesterol, Total: 227 mg/dL — ABNORMAL HIGH (ref 100–199)
HDL: 44 mg/dL (ref >39)
LDL Chol Calc (NIH): 135 mg/dL — ABNORMAL HIGH (ref 0–99)
Triglycerides: 270 mg/dL — ABNORMAL HIGH (ref 0–149)
VLDL Cholesterol Cal: 48 mg/dL — ABNORMAL HIGH (ref 5–40)

## 2023-08-24 LAB — POCT GLYCOSYLATED HEMOGLOBIN (HGB A1C): Hemoglobin A1C: 5.7 % — AB (ref 4.0–5.6)

## 2023-08-24 NOTE — Progress Notes (Signed)
   Subjective:    Patient ID: Micheal Hodge, male    DOB: 08-11-1956, 67 y.o.   MRN: [id number]  HPI He is here for a recheck.  On his last visit his hemoglobin A1c is 6.5.  Since then he has made some diet and exercise changes and wants to see how that is being done.  He recently had some blood work done at work and it did show a hemoglobin A1c of 5.2. He is also seen by hematology because of his elevated hemoglobin and hematocrit.  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  Also his lipid panel was elevated.  Review of Systems     Objective:    Physical Exam Alert and in no distress otherwise not examined hemoglobin A1c is 5.7 The notes from hematology were reviewed.      Assessment & Plan:   Problem List Items Addressed This Visit     New onset type 2 diabetes mellitus (HCC) - Primary   Relevant Orders   POCT glycosylated hemoglobin (Hb A1C) (Completed)   Secondary erythrocytosis   Other Visit Diagnoses     Hyperlipidemia associated with type 2 diabetes mellitus (HCC)       Relevant Orders   Lipid panel     I explained that if his hemoglobin A1c continues to remain low we would probably agree evaluate him to call him prediabetes rather than diabetes especially if the A1c stays at 5.7 or even lower.  He will return here in about 4 months for recheck.

## 2023-09-18 ENCOUNTER — Other Ambulatory Visit: Payer: Self-pay | Admitting: Family Medicine

## 2023-09-18 DIAGNOSIS — E785 Hyperlipidemia, unspecified: Secondary | ICD-10-CM

## 2023-12-21 ENCOUNTER — Encounter: Payer: Self-pay | Admitting: Family Medicine

## 2023-12-21 ENCOUNTER — Ambulatory Visit: Payer: 59 | Admitting: Family Medicine

## 2023-12-21 VITALS — BP 124/82 | HR 81 | Wt 207.4 lb

## 2023-12-21 DIAGNOSIS — E119 Type 2 diabetes mellitus without complications: Secondary | ICD-10-CM

## 2023-12-21 DIAGNOSIS — E7439 Other disorders of intestinal carbohydrate absorption: Secondary | ICD-10-CM

## 2023-12-21 LAB — POCT GLYCOSYLATED HEMOGLOBIN (HGB A1C): Hemoglobin A1C: 6.1 % — AB (ref 4.0–5.6)

## 2023-12-21 NOTE — Progress Notes (Signed)
  Subjective:    Patient ID: Micheal Hodge, male    DOB: [birth year], 68 y.o.   MRN: [id number]  Micheal Hodge is a 68 y.o. male who presents for follow-up of Type 2 diabetes mellitus.  Patient is not checking home blood sugars.   Home blood sugar records: patient does not check sugars How often is blood sugars being checked: n/a Current symptoms/problems include neuropathy  and have been unchanged. Daily foot checks: yes   Any foot concerns: no  Last eye exam: 2023 Exercise: The patient does not participate in regular exercise at present. He continues on no medicines for his diabetes but is taking his Zetia, Neurontin, irbesartan and Cialis.  He is having no difficulty with any of these.  He recently went on a cruise and states that he did deviate from his diet. The following portions of the patient's history were reviewed and updated as appropriate: allergies, current medications, past medical history, past social history and problem list.  ROS as in subjective above.     Objective:    Physical Exam Alert and in no distress otherwise not examined.  Hemoglobin A1c is 6.1 Lab Review    Latest Ref Rng & Units 12/21/2023    1:40 PM 08/24/2023    4:09 PM 08/24/2023    8:57 AM 03/16/2023    9:52 AM 04/02/2022    3:44 PM  Diabetic Labs  HbA1c 4.0 - 5.6 % 6.1  5.7   6.5    Chol 100 - 199 mg/dL   409  811    HDL >91 mg/dL   44  36    Calc LDL 0 - 99 mg/dL   478  295    Triglycerides 0 - 149 mg/dL   621  308    Creatinine 0.76 - 1.27 mg/dL    6.57  8.46       9/62/9528    2:42 PM 08/24/2023    3:20 PM 04/23/2023    1:46 PM 04/22/2023    4:11 PM 04/22/2023    4:05 PM  BP/Weight  Systolic BP 124 130 125 138 142  Diastolic BP 82 80 61 88 82  Wt. (Lbs) 207.4 200.6 213.4  212  BMI 28.13 kg/m2 27.21 kg/m2 28.94 kg/m2  28.75 kg/m2       No data to display          Jerral  reports that he has been smoking cigarettes and cigars. He started smoking about 52 years ago. He has never  used smokeless tobacco. He reports current alcohol use of about 20.0 standard drinks of alcohol per week. He reports that he does not use drugs.     Assessment & Plan:    Glucose intolerance - Plan: POCT glycosylated hemoglobin (Hb A1C)  Since he only had 1 A1c at the threshold, I am comfortable with relabeling him glucose intolerant but strongly encouraged him to continue to maintain his diet and exercise regimen to keep this number down.  He will return here in several months for recheck.

## 2023-12-23 ENCOUNTER — Other Ambulatory Visit: Payer: Self-pay | Admitting: Family Medicine

## 2023-12-23 DIAGNOSIS — E785 Hyperlipidemia, unspecified: Secondary | ICD-10-CM

## 2023-12-28 ENCOUNTER — Encounter: Payer: Self-pay | Admitting: Internal Medicine

## 2023-12-28 ENCOUNTER — Ambulatory Visit: Payer: 59 | Admitting: Family Medicine

## 2024-03-08 ENCOUNTER — Telehealth: Payer: Self-pay | Admitting: Hematology and Oncology

## 2024-03-29 ENCOUNTER — Encounter: Payer: 59 | Admitting: Family Medicine

## 2024-04-07 ENCOUNTER — Ambulatory Visit (INDEPENDENT_AMBULATORY_CARE_PROVIDER_SITE_OTHER)

## 2024-04-07 ENCOUNTER — Encounter (HOSPITAL_COMMUNITY): Payer: Self-pay

## 2024-04-07 ENCOUNTER — Ambulatory Visit (HOSPITAL_COMMUNITY): Admission: EM | Admit: 2024-04-07 | Discharge: 2024-04-07 | Disposition: A | Discharge status: Home / Self Care

## 2024-04-07 DIAGNOSIS — M7021 Olecranon bursitis, right elbow: Secondary | ICD-10-CM

## 2024-04-07 DIAGNOSIS — M25421 Effusion, right elbow: Secondary | ICD-10-CM

## 2024-04-07 NOTE — Discharge Instructions (Addendum)
  1. Olecranon bursitis of right elbow - DG Elbow Complete Right CT shows calcification to the olecranon process, possibly consistent with calcium  patient of olecranon bursitis versus fracture - Apply Sling & Swathe - Apply splint long arm - AMB referral to orthopedics for follow-up evaluation right potential olecranon bursitis versus right olecranon fracture

## 2024-04-07 NOTE — ED Triage Notes (Signed)
 Patient states he fell while running down hill and hit his right elbow on asphalt. 2 weeks ago.  Patient has redness and swelling of the right elbow.

## 2024-04-07 NOTE — ED Provider Notes (Signed)
 [facility]  Note:  This document was prepared using Dragon voice recognition software and may include unintentional dictation errors.  MRN: [id number] DOB: June 30, 1956  Subjective:   Micheal Hodge is a 68 y.o. male presenting for swelling to right elbow after a fall on asphalt 2 weeks ago.  Patient reports he was running downhill fell falling hard asphalt landing elbow first.  He reports there was redness and swelling patient was not concerned for fracture.  Patient now concerned because he is still having swelling 2 weeks out.  Patient denies taking any over-the-counter meds to treat symptoms.  No prior history of fracture or trauma to the right elbow.  Patient denies any significant pain at this time.  No current facility-administered medications for this encounter.  Current Outpatient Medications:    Cetirizine HCl (ALL DAY ALLERGY PO), Take by mouth as needed. (Patient not taking: Reported on 12/21/2023), Disp: , Rfl:    ezetimibe  (ZETIA ) 10 MG tablet, Take 1 tablet by mouth once daily, Disp: 30 tablet, Rfl: 5   gabapentin  (NEURONTIN ) 300 MG capsule, Take 1 capsule (300 mg total) by mouth at bedtime. (Patient not taking: Reported on 12/21/2023), Disp: 30 capsule, Rfl: 1   irbesartan -hydrochlorothiazide  (AVALIDE) 300-12.5 MG tablet, Take 1 tablet by mouth daily., Disp: 90 tablet, Rfl: 3   Multiple Vitamin (MULTI-VITAMIN) tablet, Take by mouth., Disp: , Rfl:    tadalafil  (CIALIS ) 20 MG tablet, Take 1 tablet (20 mg total) by mouth daily as needed for erectile dysfunction., Disp: 20 tablet, Rfl: 5   Allergies  Allergen Reactions   Poison Ivy Extract Rash    Has to take steroid injection.    Past Medical History:  Diagnosis Date   Allergy    Arthritis    Blood glucose elevated 09/07/2018   Bowel obstruction (HCC)    2012   Colon polyps    Diabetes mellitus (HCC) 04/12/2023   Diverticulitis    Diverticulosis    ED (erectile dysfunction)    Elevated cholesterol     Esophageal stricture    GERD (gastroesophageal reflux disease)    Headache    High blood pressure    Hyperlipidemia    Hypertension    Inflammatory bowel disease    Migraine headache    Pneumonia    Rosacea      Past Surgical History:  Procedure Laterality Date   COLONOSCOPY  11/03/2007   LUMBAR MICRODISCECTOMY     L5-S1   NASAL SEPTUM SURGERY     UPPER GASTROINTESTINAL ENDOSCOPY  11/03/2007    Family History  Problem Relation Age of Onset   Diabetes Mother    Hyperlipidemia Mother    Heart disease Mother        stents   Cancer Mother        Melanoma   Colon polyps Mother    Atrial fibrillation Mother    Hypertension Father    Heart disease Father    Heart failure Father    Glaucoma Father    Hemophilia Brother    Colon cancer Neg Hx    Esophageal cancer Neg Hx    Stomach cancer Neg Hx    Rectal cancer Neg Hx     Social History   Tobacco Use   Smoking status: Former    Current packs/day: 0.00    Types: Cigarettes, Cigars    Start date: 11/03/1971    Quit date: 11/02/1986    Years since quitting: 37.4   Smokeless tobacco: Never  Tobacco comments:    Quit smoking cigarettes in 1988 but smoke an occasional Cigar  Vaping Use   Vaping status: Never Used  Substance Use Topics   Alcohol use: Yes    Alcohol/week: 20.0 standard drinks of alcohol    Types: 20 Cans of beer per week   Drug use: No    ROS Refer to HPI for ROS details.  Objective:   Vitals: BP 132/89 (BP Location: Right Arm)   Pulse 81   Temp 97.9 F (36.6 C) (Oral)   Resp 16   SpO2 96%   Physical Exam Vitals and nursing note reviewed.  Constitutional:      General: He is not in acute distress.    Appearance: Normal appearance. He is not ill-appearing or toxic-appearing.  HENT:     Head: Normocephalic.  Cardiovascular:     Rate and Rhythm: Normal rate.  Pulmonary:     Effort: Pulmonary effort is normal. No respiratory distress.  Musculoskeletal:     Right elbow: Swelling present.  No deformity. Normal range of motion. Tenderness present.  Skin:    General: Skin is warm and dry.     Capillary Refill: Capillary refill takes less than 2 seconds.  Neurological:     General: No focal deficit present.     Mental Status: He is alert and oriented to person, place, and time.  Psychiatric:        Mood and Affect: Mood normal.        Behavior: Behavior normal.     Procedures  No results found for this or any previous visit (from the past 24 hours).  DG Elbow Complete Right Result Date: 04/07/2024 CLINICAL DATA:  Elbow swelling. EXAM: RIGHT ELBOW - COMPLETE 3+ VIEW COMPARISON:  None Available. FINDINGS: There is a chronic enthesopathic spur at the triceps insertion on the olecranon measuring up to approximately 18 mm in length and 8 mm in AP thickness. There is moderate thickening of the soft tissues posterior to this, the region of the olecranon bursa, measuring up to 18 mm in AP thickness. Normal position of the distal anterior humeral fat pad without evidence of elbow joint effusion. Mild medial elbow degenerative spurring. No acute fracture or dislocation. IMPRESSION: 1. Chronic enthesopathic spur at the triceps insertion on the olecranon. 2. Moderate thickening of the soft tissues posterior to this, the region of the olecranon bursa, measuring up to 18 mm in AP thickness. This may represent olecranon bursitis. Electronically Signed   By: Bertina Broccoli M.D.   On: 04/07/2024 14:45     Assessment and Plan :     Discharge Instructions       1. Olecranon bursitis of right elbow - DG Elbow Complete Right CT shows calcification to the olecranon process, possibly consistent with calcium  patient of olecranon bursitis versus fracture - Apply Sling & Swathe - Apply splint long arm - AMB referral to orthopedics for follow-up evaluation right potential olecranon bursitis versus right olecranon fracture    Blaike Newburn B Elwyn Hamper, Oree Hislop B, NP 04/07/24 1454

## 2024-04-07 NOTE — ED Notes (Signed)
 Ortho called for a posterior right long arm splint.

## 2024-04-11 ENCOUNTER — Other Ambulatory Visit: Payer: Self-pay | Admitting: Family Medicine

## 2024-04-11 DIAGNOSIS — I10 Essential (primary) hypertension: Secondary | ICD-10-CM

## 2024-04-25 ENCOUNTER — Ambulatory Visit: Payer: 59 | Admitting: Hematology and Oncology

## 2024-04-25 ENCOUNTER — Other Ambulatory Visit: Payer: 59

## 2024-06-13 ENCOUNTER — Other Ambulatory Visit: Payer: Self-pay | Admitting: Family Medicine

## 2024-06-13 DIAGNOSIS — I10 Essential (primary) hypertension: Secondary | ICD-10-CM

## 2024-06-20 ENCOUNTER — Ambulatory Visit (INDEPENDENT_AMBULATORY_CARE_PROVIDER_SITE_OTHER): Payer: 59 | Admitting: Family Medicine

## 2024-06-20 ENCOUNTER — Encounter: Payer: Self-pay | Admitting: Family Medicine

## 2024-06-20 VITALS — BP 118/68 | HR 76 | Ht 72.0 in | Wt 214.0 lb

## 2024-06-20 DIAGNOSIS — N390 Urinary tract infection, site not specified: Secondary | ICD-10-CM | POA: Diagnosis not present

## 2024-06-20 DIAGNOSIS — I10 Essential (primary) hypertension: Secondary | ICD-10-CM

## 2024-06-20 DIAGNOSIS — Z8669 Personal history of other diseases of the nervous system and sense organs: Secondary | ICD-10-CM

## 2024-06-20 DIAGNOSIS — Z0000 Encounter for general adult medical examination without abnormal findings: Secondary | ICD-10-CM | POA: Diagnosis not present

## 2024-06-20 DIAGNOSIS — M545 Low back pain, unspecified: Secondary | ICD-10-CM

## 2024-06-20 DIAGNOSIS — E1165 Type 2 diabetes mellitus with hyperglycemia: Secondary | ICD-10-CM

## 2024-06-20 DIAGNOSIS — K219 Gastro-esophageal reflux disease without esophagitis: Secondary | ICD-10-CM | POA: Diagnosis not present

## 2024-06-20 DIAGNOSIS — G8929 Other chronic pain: Secondary | ICD-10-CM

## 2024-06-20 DIAGNOSIS — E785 Hyperlipidemia, unspecified: Secondary | ICD-10-CM

## 2024-06-20 DIAGNOSIS — J301 Allergic rhinitis due to pollen: Secondary | ICD-10-CM

## 2024-06-20 DIAGNOSIS — Z8719 Personal history of other diseases of the digestive system: Secondary | ICD-10-CM

## 2024-06-20 DIAGNOSIS — F1020 Alcohol dependence, uncomplicated: Secondary | ICD-10-CM | POA: Diagnosis not present

## 2024-06-20 DIAGNOSIS — Z9889 Other specified postprocedural states: Secondary | ICD-10-CM

## 2024-06-20 DIAGNOSIS — E119 Type 2 diabetes mellitus without complications: Secondary | ICD-10-CM

## 2024-06-20 LAB — POCT URINALYSIS DIP (PROADVANTAGE DEVICE)
Blood, UA: NEGATIVE
Glucose, UA: NEGATIVE mg/dL
Nitrite, UA: NEGATIVE
Protein Ur, POC: 100 mg/dL — AB
Specific Gravity, Urine: 1.01
Urobilinogen, Ur: 0.2
pH, UA: 6 (ref 5.0–8.0)

## 2024-06-20 LAB — LIPID PANEL

## 2024-06-20 MED ORDER — EZETIMIBE 10 MG PO TABS: 10.0000 mg | ORAL_TABLET | Freq: Every day | ORAL | 3 refills | Status: AC

## 2024-06-20 MED ORDER — IRBESARTAN-HYDROCHLOROTHIAZIDE 300-12.5 MG PO TABS: 1.0000 | ORAL_TABLET | Freq: Every day | ORAL | 3 refills | Status: AC

## 2024-06-20 NOTE — Progress Notes (Signed)
 Complete physical exam  Patient: Micheal Hodge   DOB: 05/28/1956   68 y.o. Male  MRN: [id number]  Subjective:    Chief Complaint  Patient presents with   Annual Exam    Had labs this morning-    Discussed the use of AI scribe software for clinical note transcription with the patient, who gave verbal consent to proceed.  History of Present Illness   Micheal Hodge is a 68 year old male with gastroesophageal reflux disease and esophageal stricture who presents for a CPE.He reports consuming a general diet. Work around the home. He generally feels well. He reports sleeping poorly. He continues to experience symptoms of gastroesophageal reflux disease (GERD) and esophageal stricture. Acid reflux is particularly triggered by tomato-based foods like spaghetti. He also has difficulty swallowing and experiences food getting stuck, which he attributes to the esophageal stricture. He manages his symptoms by being cautious with his diet. He does have a history of migraine headache but has not had any recently. He has a history of alcohol consumption and is attempting to reduce his intake. He tries not to drink during the week and limits his consumption to weekends, achieving this goal about 50% of the time. He defines a drink as one shot of whiskey, one beer, or one glass of wine.  He was diagnosed with diabetes, with a hemoglobin A1c of 6.5% in May of last year, which decreased to 6.1% in February. His recent A1c is 6.2%.  He underwent lumbar surgery in April or May of 2022 for a herniated disc, performed by Dr. Victory Boers. He describes the surgery as the removal of disc material and notes that he is not back to 100% of his pre-surgery condition.  He was evaluated for polycythemia and was referred to a cancer center where blood work did not confirm the diagnosis. He was diagnosed with clonal hematopoiesis of indeterminate potential (CHIP).  He has a history of migraines but has not experienced a  migraine in a long time.       Most recent fall risk assessment:    06/20/2024    1:55 PM  Fall Risk   Falls in the past year? 1  Number falls in past yr: 1  Injury with Fall? 1  Comment bone spurs from the fall after climbing a mountain  Risk for fall due to : Other (Comment)  Follow up Falls evaluation completed     Most recent depression screenings:    06/20/2024    1:56 PM 12/21/2023    2:39 PM  PHQ 2/9 Scores  PHQ - 2 Score 0 0        Immunization History  Administered Date(s) Administered   Influenza Split 08/02/2012, 08/02/2022   Influenza, High Dose Seasonal PF 08/19/2023   Influenza-Unspecified 08/20/2015, 08/31/2016, 09/09/2017, 08/03/2018, 08/29/2020, 07/16/2021   PFIZER(Purple Top)SARS-COV-2 Vaccination 01/10/2020, 02/07/2020   PNEUMOCOCCAL CONJUGATE-20 03/16/2023   Tdap 12/05/2012, 03/16/2023   Zoster Recombinant(Shingrix) 03/12/2020, 05/20/2020    Health Maintenance  Topic Date Due   FOOT EXAM  Never done   OPHTHALMOLOGY EXAM  Never done   Diabetic kidney evaluation - Urine ACR  Never done   Diabetic kidney evaluation - eGFR measurement  03/15/2024   HEMOGLOBIN A1C  06/19/2024   COVID-19 Vaccine (3 - 2024-25 season) 07/06/2024 (Originally 07/04/2023)   INFLUENZA VACCINE  01/30/2025 (Originally 06/02/2024)   Fecal DNA (Cologuard)  10/31/2024   Colonoscopy  07/22/2032   DTaP/Tdap/Td (3 - Td or Tdap) 03/15/2033  Pneumococcal Vaccine: 50+ Years  Completed   Hepatitis C Screening  Completed   Zoster Vaccines- Shingrix  Completed   HPV VACCINES  Aged Out   Meningococcal B Vaccine  Aged Out   Pneumococcal Vaccine  Discontinued    Patient Care Team: Joyce Norleen BROCKS, MD as PCP - General (Family Medicine) Dann Candyce RAMAN, MD as PCP - Cardiology (Cardiology)   Outpatient Medications Prior to Visit  Medication Sig   Cetirizine HCl (ALL DAY ALLERGY PO) Take by mouth as needed.   Multiple Vitamin (MULTI-VITAMIN) tablet Take by mouth.   tadalafil   (CIALIS ) 20 MG tablet Take 1 tablet (20 mg total) by mouth daily as needed for erectile dysfunction.   [DISCONTINUED] ezetimibe  (ZETIA ) 10 MG tablet Take 1 tablet by mouth once daily   [DISCONTINUED] irbesartan -hydrochlorothiazide  (AVALIDE) 300-12.5 MG tablet Take 1 tablet by mouth once daily   gabapentin  (NEURONTIN ) 300 MG capsule Take 1 capsule (300 mg total) by mouth at bedtime. (Patient not taking: Reported on 06/20/2024)   No facility-administered medications prior to visit.    Review of Systems  All other systems reviewed and are negative.   Family and social history as well as health maintenance and immunizations was reviewed.     Objective:    BP 118/68   Pulse 76   Ht 6' (1.829 m)   Wt 214 lb (97.1 kg)   SpO2 98%   BMI 29.02 kg/m    Physical Exam  Alert and in no distress. Tympanic membranes and canals are normal. Pharyngeal area is normal. Neck is supple without adenopathy or thyromegaly. Cardiac exam shows a regular sinus rhythm without murmurs or gallops. Lungs are clear to auscultation. Hemoglobin A1c is 6.2 The 10-year ASCVD risk score (Arnett DK, et al., 2019) is: 28.5%   Values used to calculate the score:     Age: 79 years     Clincally relevant sex: Male     Is Non-Hispanic African American: No     Diabetic: Yes     Tobacco smoker: No     Systolic Blood Pressure: 118 mmHg     Is BP treated: Yes     HDL Cholesterol: 39 mg/dL     Total Cholesterol: 202 mg/dL     Assessment & Plan:    Discussed health benefits of physical activity, and encouraged him to engage in regular exercise appropriate for his age and condition. Routine general medical examination at a health care facilityssessment and Plan    Type 2 diabetes mellitus Type 2 diabetes mellitus with recent hemoglobin A1c of 6.2, indicating good control. No current need for medication; focus on lifestyle modifications. - Encourage diet and exercise to maintain glycemic control. - Advise reducing  alcohol consumption. - Recommend regular monitoring of blood glucose levels.  Gastroesophageal reflux disease and esophageal stricture Gastroesophageal reflux disease with esophageal stricture causing occasional swallowing difficulties and acid reflux. Symptoms managed with dietary modifications. - Advise continued dietary modifications to avoid trigger foods. - Monitor for worsening symptoms or increased frequency of swallowing difficulties.  Alcohol use, ongoing Ongoing alcohol use with efforts to reduce intake. Goal to limit intake to no more than two standard drinks per day. - Advise limiting alcohol intake to no more than two standard drinks per day. - Encourage continued efforts to reduce alcohol consumption.  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 Follow-up recommended due to potential alcohol-related overproduction of red blood cells.  Lumbar disc herniation,  post-surgical Status post lumbar disc herniation surgery in April 2022. Residual back pain present but improved since surgery.  Allergic rhinitis Allergic rhinitis with increased symptoms this year, managed with over-the-counter medications. - Continue current over-the-counter allergy medications as needed.     Also he is now no longer guardian of his son and has been turned over to the state.  Apparently his son has bought a car but does not have a driver's license and is not capable of driving.  Being removed his guardian will legally put him at much risk risk.   Return in about 6 months (around 12/21/2024).      Norleen Jobs, MD

## 2024-06-20 NOTE — Addendum Note (Signed)
 Addended by: JOHNNYE JON SQUIBB on: 06/20/2024 05:13 PM   Modules accepted: Orders

## 2024-06-21 ENCOUNTER — Ambulatory Visit: Payer: Self-pay | Admitting: Family Medicine

## 2024-06-21 DIAGNOSIS — E785 Hyperlipidemia, unspecified: Secondary | ICD-10-CM

## 2024-06-21 LAB — COMPREHENSIVE METABOLIC PANEL WITH GFR
ALT: 24 IU/L (ref 0–44)
AST: 20 IU/L (ref 0–40)
Albumin: 4.2 g/dL (ref 3.9–4.9)
Alkaline Phosphatase: 114 IU/L (ref 44–121)
BUN/Creatinine Ratio: 12 (ref 10–24)
BUN: 9 mg/dL (ref 8–27)
Bilirubin Total: 0.5 mg/dL (ref 0.0–1.2)
CO2: 22 mmol/L (ref 20–29)
Calcium: 9.7 mg/dL (ref 8.6–10.2)
Chloride: 100 mmol/L (ref 96–106)
Creatinine, Ser: 0.74 mg/dL — AB (ref 0.76–1.27)
Globulin, Total: 2.4 g/dL (ref 1.5–4.5)
Glucose: 124 mg/dL — AB (ref 70–99)
Potassium: 4.5 mmol/L (ref 3.5–5.2)
Sodium: 137 mmol/L (ref 134–144)
Total Protein: 6.6 g/dL (ref 6.0–8.5)
eGFR: 100 mL/min/1.73 (ref >59)

## 2024-06-21 LAB — CBC WITH DIFFERENTIAL/PLATELET
Basophils Absolute: 0.1 x10E3/uL (ref 0.0–0.2)
Basos: 1 %
EOS (ABSOLUTE): 0.2 x10E3/uL (ref 0.0–0.4)
Eos: 2 %
Hematocrit: 48.3 % (ref 37.5–51.0)
Hemoglobin: 15.7 g/dL (ref 13.0–17.7)
Immature Grans (Abs): 0.1 x10E3/uL (ref 0.0–0.1)
Immature Granulocytes: 1 %
Lymphocytes Absolute: 2.1 x10E3/uL (ref 0.7–3.1)
Lymphs: 27 %
MCH: 30 pg (ref 26.6–33.0)
MCHC: 32.5 g/dL (ref 31.5–35.7)
MCV: 92 fL (ref 79–97)
Monocytes Absolute: 0.7 x10E3/uL (ref 0.1–0.9)
Monocytes: 9 %
Neutrophils Absolute: 4.6 x10E3/uL (ref 1.4–7.0)
Neutrophils: 60 %
Platelets: 309 x10E3/uL (ref 150–450)
RBC: 5.24 x10E6/uL (ref 4.14–5.80)
RDW: 12.8 % (ref 11.6–15.4)
WBC: 7.7 x10E3/uL (ref 3.4–10.8)

## 2024-06-21 LAB — LIPID PANEL
Cholesterol, Total: 202 mg/dL — AB (ref 100–199)
HDL: 39 mg/dL — AB (ref >39)
LDL CALC COMMENT:: 5.2 ratio — AB (ref 0.0–5.0)
LDL Chol Calc (NIH): 118 mg/dL — AB (ref 0–99)
Triglycerides: 258 mg/dL — AB (ref 0–149)
VLDL Cholesterol Cal: 45 mg/dL — AB (ref 5–40)

## 2024-06-21 MED ORDER — ROSUVASTATIN CALCIUM 20 MG PO TABS: 20.0000 mg | ORAL_TABLET | Freq: Every day | ORAL | 3 refills | Status: AC

## 2024-12-21 ENCOUNTER — Ambulatory Visit: Payer: Self-pay | Admitting: Family Medicine

## 2025-06-21 ENCOUNTER — Encounter: Payer: Self-pay | Admitting: Family Medicine
